# Patient Record
Sex: Female | Born: 1970 | Race: Black or African American | Hispanic: No | Marital: Married | Smoking: Former smoker
Health system: Southern US, Community
[De-identification: ages and names within clinical notes are randomized; demographics above are authoritative.]

## PROBLEM LIST (undated history)

## (undated) ENCOUNTER — Inpatient Hospital Stay (HOSPITAL_COMMUNITY): Payer: Self-pay

## (undated) DIAGNOSIS — F329 Major depressive disorder, single episode, unspecified: Secondary | ICD-10-CM

## (undated) DIAGNOSIS — F32A Depression, unspecified: Secondary | ICD-10-CM

## (undated) DIAGNOSIS — F419 Anxiety disorder, unspecified: Secondary | ICD-10-CM

## (undated) HISTORY — PX: NO PAST SURGERIES: SHX2092

---

## 1999-08-10 ENCOUNTER — Encounter: Payer: Self-pay | Admitting: Emergency Medicine

## 1999-08-10 ENCOUNTER — Emergency Department (HOSPITAL_COMMUNITY): Admission: EM | Admit: 1999-08-10 | Discharge: 1999-08-10 | Payer: Self-pay | Admitting: Emergency Medicine

## 2000-07-18 ENCOUNTER — Encounter: Payer: Self-pay | Admitting: Internal Medicine

## 2000-07-18 ENCOUNTER — Emergency Department (HOSPITAL_COMMUNITY): Admission: EM | Admit: 2000-07-18 | Discharge: 2000-07-18 | Payer: Self-pay | Admitting: Internal Medicine

## 2000-10-25 ENCOUNTER — Other Ambulatory Visit: Admission: RE | Admit: 2000-10-25 | Discharge: 2000-10-25 | Payer: Self-pay | Admitting: Obstetrics

## 2001-01-18 ENCOUNTER — Inpatient Hospital Stay (HOSPITAL_COMMUNITY): Admission: AD | Admit: 2001-01-18 | Discharge: 2001-01-18 | Payer: Self-pay | Admitting: Obstetrics

## 2001-01-18 ENCOUNTER — Encounter: Payer: Self-pay | Admitting: Obstetrics

## 2001-02-17 ENCOUNTER — Inpatient Hospital Stay (HOSPITAL_COMMUNITY): Admission: AD | Admit: 2001-02-17 | Discharge: 2001-02-19 | Payer: Self-pay | Admitting: Obstetrics

## 2007-01-31 ENCOUNTER — Inpatient Hospital Stay (HOSPITAL_COMMUNITY): Admission: AD | Admit: 2007-01-31 | Discharge: 2007-01-31 | Payer: Self-pay | Admitting: Obstetrics & Gynecology

## 2007-03-07 ENCOUNTER — Ambulatory Visit (HOSPITAL_COMMUNITY): Admission: RE | Admit: 2007-03-07 | Discharge: 2007-03-07 | Payer: Self-pay | Admitting: Obstetrics & Gynecology

## 2007-04-23 ENCOUNTER — Ambulatory Visit (HOSPITAL_COMMUNITY): Admission: RE | Admit: 2007-04-23 | Discharge: 2007-04-23 | Payer: Self-pay | Admitting: Obstetrics & Gynecology

## 2007-06-29 ENCOUNTER — Inpatient Hospital Stay (HOSPITAL_COMMUNITY): Admission: AD | Admit: 2007-06-29 | Discharge: 2007-07-01 | Payer: Self-pay | Admitting: Obstetrics & Gynecology

## 2010-08-24 ENCOUNTER — Ambulatory Visit (HOSPITAL_COMMUNITY): Admission: RE | Admit: 2010-08-24 | Discharge: 2010-08-24 | Payer: Self-pay | Admitting: Obstetrics

## 2010-12-31 ENCOUNTER — Encounter: Payer: Self-pay | Admitting: Obstetrics

## 2011-02-02 ENCOUNTER — Encounter (HOSPITAL_COMMUNITY): Payer: Self-pay

## 2011-02-02 ENCOUNTER — Other Ambulatory Visit: Payer: Self-pay | Admitting: Obstetrics

## 2011-02-02 ENCOUNTER — Inpatient Hospital Stay (HOSPITAL_COMMUNITY)
Admission: AD | Admit: 2011-02-02 | Discharge: 2011-02-02 | Disposition: A | Discharge status: Home / Self Care | Payer: Medicaid Other | Source: Ambulatory Visit | Attending: Obstetrics | Admitting: Obstetrics

## 2011-02-02 ENCOUNTER — Ambulatory Visit (HOSPITAL_COMMUNITY)
Admission: RE | Admit: 2011-02-02 | Discharge: 2011-02-02 | Disposition: A | Discharge status: Home / Self Care | Payer: Medicaid Other | Source: Ambulatory Visit | Attending: Obstetrics | Admitting: Obstetrics

## 2011-02-02 DIAGNOSIS — O480 Post-term pregnancy: Secondary | ICD-10-CM | POA: Insufficient documentation

## 2011-02-02 DIAGNOSIS — O288 Other abnormal findings on antenatal screening of mother: Secondary | ICD-10-CM

## 2011-02-02 DIAGNOSIS — O368390 Maternal care for abnormalities of the fetal heart rate or rhythm, unspecified trimester, not applicable or unspecified: Secondary | ICD-10-CM | POA: Insufficient documentation

## 2011-02-03 ENCOUNTER — Inpatient Hospital Stay (HOSPITAL_COMMUNITY)
Admission: RE | Admit: 2011-02-03 | Discharge: 2011-02-05 | DRG: 775 | Disposition: A | Discharge status: Home / Self Care | Payer: Medicaid Other | Source: Ambulatory Visit | Attending: Obstetrics | Admitting: Obstetrics

## 2011-02-03 DIAGNOSIS — D259 Leiomyoma of uterus, unspecified: Secondary | ICD-10-CM | POA: Diagnosis present

## 2011-02-03 DIAGNOSIS — D4959 Neoplasm of unspecified behavior of other genitourinary organ: Secondary | ICD-10-CM | POA: Diagnosis present

## 2011-02-03 DIAGNOSIS — O34599 Maternal care for other abnormalities of gravid uterus, unspecified trimester: Secondary | ICD-10-CM | POA: Diagnosis present

## 2011-02-03 DIAGNOSIS — O9903 Anemia complicating the puerperium: Secondary | ICD-10-CM | POA: Diagnosis not present

## 2011-02-03 DIAGNOSIS — D649 Anemia, unspecified: Secondary | ICD-10-CM | POA: Diagnosis not present

## 2011-02-03 DIAGNOSIS — O09529 Supervision of elderly multigravida, unspecified trimester: Principal | ICD-10-CM | POA: Diagnosis present

## 2011-02-03 LAB — CBC
HCT: 32.1 % — ABNORMAL LOW (ref 36.0–46.0)
RDW: 21.2 % — ABNORMAL HIGH (ref 11.5–15.5)
WBC: 9 10*3/uL (ref 4.0–10.5)

## 2011-02-03 LAB — RPR: RPR Ser Ql: NONREACTIVE

## 2011-02-04 LAB — CBC
MCV: 66.3 fL — ABNORMAL LOW (ref 78.0–100.0)
Platelets: 210 10*3/uL (ref 150–400)
RBC: 4.15 MIL/uL (ref 3.87–5.11)
RDW: 21.1 % — ABNORMAL HIGH (ref 11.5–15.5)
WBC: 10.2 10*3/uL (ref 4.0–10.5)

## 2011-02-07 ENCOUNTER — Other Ambulatory Visit (HOSPITAL_COMMUNITY): Payer: Self-pay

## 2011-02-14 NOTE — H&P (Addendum)
  Melinda Bridges, Melinda Bridges     ACCOUNT NO.:  [id number]  MEDICAL RECORD NO.:  [id number]           PATIENT TYPE:  I  LOCATION:  9140                          FACILITY:  [facility]  PHYSICIAN:  Roseanna Rainbow, M.D.DATE OF BIRTH:  [birth year]  DATE OF ADMISSION:  02/03/2011 DATE OF DISCHARGE:                             HISTORY & PHYSICAL   CHIEF COMPLAINT:  The patient is a 40 year old, para 4 with an estimated date of confinement of January 31, 2011, who presents for induction of labor.  HISTORY OF PRESENT ILLNESS:  The patient had presented for routine prenatal visits several days prior to admission.  She was complaining of decreased fetal movement and BPP was 8/8.  ALLERGIES:  No known drug allergies.  MEDICATIONS:  Please see the medication reconciliation form.  OBSTETRIC RISK FACTORS:  Advanced maternal age, uterine fibroids, history of anemia, previous SGA infant.  PRENATAL LABORATORY DATA:  Chlamydia probe negative.  Urine culture and sensitivity no growth.  Pap smear negative.  GC probe negative.  GBS negative on January 09, 2011.  Hepatitis B surface antigen negative. Hematocrit 29.9, hemoglobin 9.2.  Blood type is A+.  Antibody screen negative.  Platelets 225,000.  Rubella nonimmune.  Sickle cell negative. Quad screen negative.  PAST OBSTETRIC HISTORY:  There is a history of 4 spontaneous vaginal deliveries at term, birth weights ranging between 5 and 6 pounds.  No complications.  PAST GYNECOLOGICAL HISTORY:  Noncontributory.  PAST MEDICAL HISTORY:  Please see the above.  There is history of depression.  PAST SURGICAL HISTORY:  She denies.  SOCIAL HISTORY:  She is a homemaker, married, living with her spouse, not currently using alcohol, formerly minimal user, previously smoked less than 1/2 pack per day.  Stress issues include children in the home and financial difficulties, unemployed.  She denies illicit drug use.  FAMILY HISTORY:  Remarkable for  adult-onset diabetes and hypertension.  REVIEW OF SYSTEMS:  Noncontributory.  PHYSICAL EXAMINATION:  Vital signs stable, afebrile.  Fetal heart tracing baseline 140s beats per minute, moderate long-term variability. Accelerations present.  Tocodynamometer, irregular uterine contractions. Sterile vaginal exam per the RN.  The cervix is 1 cm, dilated.  ASSESSMENT:  Multipara at 40+ weeks, for induction of labor, borderline Bishop score, category 1 fetal heart tracing.  PLAN:  Admission; 2-stage induction of labor, anticipate a spontaneous vaginal delivery.     Roseanna Rainbow, M.D.     Melinda Bridges  D:  02/03/2011  T:  02/04/2011  Job:  [id number]  Electronically Signed by Antionette Char M.D. on 02/14/2011 07:36:58 PM

## 2011-09-25 LAB — CBC
HCT: 28.6 — ABNORMAL LOW
HCT: 38.1
Hemoglobin: 11.8 — ABNORMAL LOW
Hemoglobin: 9 — ABNORMAL LOW
MCHC: 31.3
MCV: 67.7 — ABNORMAL LOW
RBC: 4.23
RBC: 5.68 — ABNORMAL HIGH
RDW: 24 — ABNORMAL HIGH
WBC: 13.8 — ABNORMAL HIGH
WBC: 7.8

## 2011-09-26 ENCOUNTER — Encounter (HOSPITAL_COMMUNITY): Payer: Self-pay | Admitting: *Deleted

## 2013-06-04 ENCOUNTER — Inpatient Hospital Stay (HOSPITAL_COMMUNITY)
Admission: AD | Admit: 2013-06-04 | Discharge: 2013-06-09 | DRG: 885 | Disposition: A | Discharge status: Home / Self Care | Payer: Federal, State, Local not specified - Other | Source: Intra-hospital | Attending: Psychiatry | Admitting: Psychiatry

## 2013-06-04 ENCOUNTER — Encounter (HOSPITAL_COMMUNITY): Payer: Self-pay | Admitting: Behavioral Health

## 2013-06-04 ENCOUNTER — Emergency Department (HOSPITAL_COMMUNITY)
Admission: EM | Admit: 2013-06-04 | Discharge: 2013-06-04 | Disposition: A | Discharge status: Other Acute Inpatient Hospital | Payer: No Typology Code available for payment source | Attending: Emergency Medicine | Admitting: Emergency Medicine

## 2013-06-04 ENCOUNTER — Encounter (HOSPITAL_COMMUNITY): Payer: Self-pay | Admitting: Family Medicine

## 2013-06-04 DIAGNOSIS — Z79899 Other long term (current) drug therapy: Secondary | ICD-10-CM

## 2013-06-04 DIAGNOSIS — Z3202 Encounter for pregnancy test, result negative: Secondary | ICD-10-CM | POA: Insufficient documentation

## 2013-06-04 DIAGNOSIS — F39 Unspecified mood [affective] disorder: Secondary | ICD-10-CM | POA: Insufficient documentation

## 2013-06-04 DIAGNOSIS — F1210 Cannabis abuse, uncomplicated: Secondary | ICD-10-CM | POA: Diagnosis present

## 2013-06-04 DIAGNOSIS — R45851 Suicidal ideations: Secondary | ICD-10-CM

## 2013-06-04 DIAGNOSIS — F4310 Post-traumatic stress disorder, unspecified: Secondary | ICD-10-CM

## 2013-06-04 DIAGNOSIS — F3340 Major depressive disorder, recurrent, in remission, unspecified: Secondary | ICD-10-CM

## 2013-06-04 DIAGNOSIS — F329 Major depressive disorder, single episode, unspecified: Secondary | ICD-10-CM

## 2013-06-04 DIAGNOSIS — IMO0002 Reserved for concepts with insufficient information to code with codable children: Secondary | ICD-10-CM

## 2013-06-04 DIAGNOSIS — F411 Generalized anxiety disorder: Secondary | ICD-10-CM | POA: Diagnosis present

## 2013-06-04 DIAGNOSIS — F331 Major depressive disorder, recurrent, moderate: Secondary | ICD-10-CM | POA: Diagnosis present

## 2013-06-04 DIAGNOSIS — F332 Major depressive disorder, recurrent severe without psychotic features: Principal | ICD-10-CM | POA: Diagnosis present

## 2013-06-04 HISTORY — DX: Major depressive disorder, single episode, unspecified: F32.9

## 2013-06-04 HISTORY — DX: Anxiety disorder, unspecified: F41.9

## 2013-06-04 HISTORY — DX: Depression, unspecified: F32.A

## 2013-06-04 LAB — RAPID URINE DRUG SCREEN, HOSP PERFORMED
Barbiturates: NOT DETECTED
Benzodiazepines: NOT DETECTED
Cocaine: NOT DETECTED

## 2013-06-04 LAB — CBC
HCT: 35.2 % — ABNORMAL LOW (ref 36.0–46.0)
Hemoglobin: 10.9 g/dL — ABNORMAL LOW (ref 12.0–15.0)
MCH: 21.6 pg — ABNORMAL LOW (ref 26.0–34.0)
MCHC: 31 g/dL (ref 30.0–36.0)
MCV: 69.8 fL — ABNORMAL LOW (ref 78.0–100.0)
RDW: 18.5 % — ABNORMAL HIGH (ref 11.5–15.5)

## 2013-06-04 LAB — COMPREHENSIVE METABOLIC PANEL
Albumin: 4.2 g/dL (ref 3.5–5.2)
Alkaline Phosphatase: 43 U/L (ref 39–117)
BUN: 14 mg/dL (ref 6–23)
Creatinine, Ser: 0.84 mg/dL (ref 0.50–1.10)
GFR calc Af Amer: >90 mL/min (ref >90)
Glucose, Bld: 98 mg/dL (ref 70–99)
Potassium: 3.7 mEq/L (ref 3.5–5.1)
Total Protein: 7.7 g/dL (ref 6.0–8.3)

## 2013-06-04 LAB — ETHANOL: Alcohol, Ethyl (B): <11 mg/dL (ref 0–11)

## 2013-06-04 LAB — POCT PREGNANCY, URINE: Preg Test, Ur: NEGATIVE

## 2013-06-04 LAB — SALICYLATE LEVEL: Salicylate Lvl: <2 mg/dL — ABNORMAL LOW (ref 2.8–20.0)

## 2013-06-04 MED ORDER — ALUM & MAG HYDROXIDE-SIMETH 200-200-20 MG/5ML PO SUSP: 30.0000 mL | ORAL | Status: DC | PRN

## 2013-06-04 MED ORDER — LORAZEPAM 1 MG PO TABS: 1.0000 mg | ORAL_TABLET | Freq: Three times a day (TID) | ORAL | Status: DC | PRN

## 2013-06-04 MED ORDER — ACETAMINOPHEN 325 MG PO TABS
650.0000 mg | ORAL_TABLET | Freq: Four times a day (QID) | ORAL | Status: DC | PRN
Start: 2013-06-04 — End: 2013-06-09

## 2013-06-04 MED ORDER — TRAZODONE HCL 50 MG PO TABS
50.0000 mg | ORAL_TABLET | Freq: Every evening | ORAL | Status: DC | PRN
  Administered 2013-06-04 – 2013-06-08 (×6): 50 mg via ORAL
  Filled 2013-06-04 (×12): qty 1

## 2013-06-04 MED ORDER — DIAZEPAM 5 MG PO TABS
5.0000 mg | ORAL_TABLET | Freq: Once | ORAL | Status: AC
  Administered 2013-06-04: 5 mg via ORAL
  Filled 2013-06-04: qty 1

## 2013-06-04 MED ORDER — MAGNESIUM HYDROXIDE 400 MG/5ML PO SUSP: 30.0000 mL | Freq: Every day | ORAL | Status: DC | PRN

## 2013-06-04 MED ORDER — TRAZODONE HCL 50 MG PO TABS: 50.0000 mg | ORAL_TABLET | Freq: Every evening | ORAL | Status: DC | PRN

## 2013-06-04 NOTE — ED Provider Notes (Signed)
History    CSN: [id number] Arrival date & time 06/04/13  0100  First MD Initiated Contact with Patient 06/04/13 [phone]     Chief Complaint  Patient presents with  . Medical Clearance   (Consider location/radiation/quality/duration/timing/severity/associated sxs/prior Treatment) HPI History provided by patient and her IVC paperwork. Brought in by [employer]. Husband took out IVC paperwork, states patient has depression and there is concern for safety at home. Patient reportedly weighs denies in front of children and has caused damage at home. Patient admits to multiple stressors. She denies any self injury or suicidal ideation. Symptoms moderate in severity. No psych medications. Was previously treated for depression but stopped taking medications because they "did not work".  She denies any alcohol or drug use.  History reviewed. No pertinent past medical history. History reviewed. No pertinent past surgical history. No family history on file. History  Substance Use Topics  . Smoking status: Never Smoker   . Smokeless tobacco: Not on file  . Alcohol Use: No   OB History   Grav Para Term Preterm Abortions TAB SAB Ect Mult Living   1 1 1             Review of Systems  Constitutional: Negative for fever and chills.  HENT: Negative for neck pain and neck stiffness.   Eyes: Negative for pain.  Respiratory: Negative for shortness of breath.   Cardiovascular: Negative for chest pain.  Gastrointestinal: Negative for vomiting and abdominal pain.  Genitourinary: Negative for dysuria.  Musculoskeletal: Negative for back pain.  Skin: Negative for rash.  Neurological: Negative for headaches.  Psychiatric/Behavioral: Negative for hallucinations and self-injury.  All other systems reviewed and are negative.    Allergies  Review of patient's allergies indicates no known allergies.  Home Medications  No current outpatient prescriptions on file. BP 113/64  Pulse 62  Temp(Src) 99.1 F (37.3  C) (Oral)  Resp 18  Ht 5\' 3"  (1.6 m)  Wt 116 lb 6 oz (52.787 kg)  BMI 20.62 kg/m2  SpO2 100%  LMP 05/27/2013  Breastfeeding? No Physical Exam  Nursing note and vitals reviewed. Constitutional: She is oriented to person, place, and time. She appears well-developed and well-nourished.  HENT:  Head: Normocephalic and atraumatic.  Eyes: EOM are normal. Pupils are equal, round, and reactive to light.  Neck: Neck supple.  Cardiovascular: Normal rate, normal heart sounds and intact distal pulses.   Pulmonary/Chest: Effort normal and breath sounds normal. No respiratory distress.  Musculoskeletal: Normal range of motion. She exhibits no edema.  Neurological: She is alert and oriented to person, place, and time.  Skin: Skin is warm and dry.  Psychiatric:  cooperative and appropriate    ED Course  Procedures (including critical care time)  Results for orders placed during the hospital encounter of 06/04/13  ACETAMINOPHEN LEVEL      Result Value Range   Acetaminophen (Tylenol), Serum <15.0  10 - 30 ug/mL  CBC      Result Value Range   WBC 7.0  4.0 - 10.5 K/uL   RBC 5.04  3.87 - 5.11 MIL/uL   Hemoglobin 10.9 (*) 12.0 - 15.0 g/dL   HCT 56.2 (*) 13.0 - 86.5 %   MCV 69.8 (*) 78.0 - 100.0 fL   MCH 21.6 (*) 26.0 - 34.0 pg   MCHC 31.0  30.0 - 36.0 g/dL   RDW 78.4 (*) 69.6 - 29.5 %   Platelets 250  150 - 400 K/uL  COMPREHENSIVE METABOLIC PANEL  Result Value Range   Sodium 136  135 - 145 mEq/L   Potassium 3.7  3.5 - 5.1 mEq/L   Chloride 102  96 - 112 mEq/L   CO2 29  19 - 32 mEq/L   Glucose, Bld 98  70 - 99 mg/dL   BUN 14  6 - 23 mg/dL   Creatinine, Ser 1.61  0.50 - 1.10 mg/dL   Calcium 9.7  8.4 - 09.6 mg/dL   Total Protein 7.7  6.0 - 8.3 g/dL   Albumin 4.2  3.5 - 5.2 g/dL   AST 17  0 - 37 U/L   ALT 13  0 - 35 U/L   Alkaline Phosphatase 43  39 - 117 U/L   Total Bilirubin 0.2 (*) 0.3 - 1.2 mg/dL   GFR calc non Af Amer 86 (*) >90 mL/min   GFR calc Af Amer >90  >90 mL/min   ETHANOL      Result Value Range   Alcohol, Ethyl (B) <11  0 - 11 mg/dL  SALICYLATE LEVEL      Result Value Range   Salicylate Lvl <2.0 (*) 2.8 - 20.0 mg/dL  URINE RAPID DRUG SCREEN (HOSP PERFORMED)      Result Value Range   Opiates NONE DETECTED  NONE DETECTED   Cocaine NONE DETECTED  NONE DETECTED   Benzodiazepines NONE DETECTED  NONE DETECTED   Amphetamines NONE DETECTED  NONE DETECTED   Tetrahydrocannabinol POSITIVE (*) NONE DETECTED   Barbiturates NONE DETECTED  NONE DETECTED  POCT PREGNANCY, URINE      Result Value Range   Preg Test, Ur NEGATIVE  NEGATIVE   Valium provided  Plan psychiatric evaluation/ disposition  MDM  IVC, history of depression  Labs obtained and reviewed as above    Sunnie Nielsen, MD 06/04/13 [phone]
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
## 2013-06-04 NOTE — ED Notes (Signed)
Patient states that she and her husband had an argument tonight. States her husband "blames her for everything." States that she has anger problems which she has received help for in the past. Denies using knives tonight but has used them in the past, approx 5 months ago.

## 2013-06-04 NOTE — Progress Notes (Signed)
[facility] has seen patient and provided her with a oc application. °

## 2013-06-04 NOTE — ED Notes (Signed)
Report given to Jan RN

## 2013-06-04 NOTE — ED Notes (Signed)
ACT team at bedside.  

## 2013-06-04 NOTE — Progress Notes (Signed)
Melinda Bridges listed on pt scanned medicaid card EPIC updated

## 2013-06-04 NOTE — Tx Team (Signed)
Initial Interdisciplinary Treatment Plan  PATIENT STRENGTHS: (choose at least two) Ability for insight Capable of independent living Communication skills General fund of knowledge Motivation for treatment/growth  PATIENT STRESSORS: Financial difficulties Marital or family conflict Occupational concerns   PROBLEM LIST: Problem List/Patient Goals Date to be addressed Date deferred Reason deferred Estimated date of resolution  Depression      Anxiety      Financial Difficulties      Marital Conflict                                     DISCHARGE CRITERIA:  Ability to meet basic life and health needs Adequate post-discharge living arrangements Improved stabilization in mood, thinking, and/or behavior Motivation to continue treatment in a less acute level of care  PRELIMINARY DISCHARGE PLAN: Attend aftercare/continuing care group Outpatient therapy Participate in family therapy Return to previous living arrangement  PATIENT/FAMIILY INVOLVEMENT: This treatment plan has been presented to and reviewed with the patient, Melinda Bridges.  The patient and family have been given the opportunity to ask questions and make suggestions.  Harold Barban E 06/04/2013, 4:31 PM

## 2013-06-04 NOTE — ED Notes (Signed)
Security called

## 2013-06-04 NOTE — ED Notes (Signed)
One bag of belongings locked in locker #26 in [place]
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
## 2013-06-05 DIAGNOSIS — F39 Unspecified mood [affective] disorder: Secondary | ICD-10-CM

## 2013-06-05 MED ORDER — FLUOXETINE HCL 20 MG PO CAPS
20.0000 mg | ORAL_CAPSULE | Freq: Every day | ORAL | Status: DC
  Administered 2013-06-05 – 2013-06-07 (×3): 20 mg via ORAL
  Filled 2013-06-05 (×5): qty 1

## 2013-06-05 MED ORDER — HYDROXYZINE HCL 25 MG PO TABS
25.0000 mg | ORAL_TABLET | Freq: Three times a day (TID) | ORAL | Status: DC
  Administered 2013-06-05 – 2013-06-07 (×6): 25 mg via ORAL
  Filled 2013-06-05 (×12): qty 1

## 2013-06-05 NOTE — Progress Notes (Signed)
Patient ID: Melinda Bridges, female   DOB: 1971/05/31, 42 y.o.   MRN: [id number]  D: Pt denies SI/HI/AVH. Pt is pleasant and cooperative. Pt states " I had a lot of anger, resentment, and hurt when I came in here, but I feel better now, I feel more comfortable opening up with my feelings, the groups are helping me be more open and the meds are helping me focus.  A: Pt was offered support and encouragement. Pt was given scheduled medications. Pt was encourage to attend groups. Q 15 minute checks were done for safety.   R:Pt attended Karaoke  and interacts well with peers and staff. Pt is taking medication. Pt has no complaints at this time.Pt receptive to treatment and safety maintained on unit.

## 2013-06-05 NOTE — Progress Notes (Signed)
D: Patient cooperative with staff and peers. Patient's affect is appropriate to circumstance, but mood is anxious. She reported on the self inventory sheet that she requested medication for sleep, appetite/ability to pay attention are improving and energy level is low. Patient rated depression "4" and feelings of hopelessness "8". Writer observed patient talking and laughing with peers in the dayroom. She's attending and participating in groups.  A: Support and encouragement provided to patient. Scheduled medications administered per MD orders. Maintain Q15 minute checks for safety.  R: Patient receptive. Denies SI/HI/AVH. Patient remains safe.

## 2013-06-05 NOTE — Progress Notes (Signed)
Adult Psychoeducational Group Note  Date:  06/05/2013 Time:  3:02 PM  Group Topic/Focus:  Overcoming Stress:   The focus of this group is to define stress and help patients assess their triggers.  Participation Level:  Minimal  Participation Quality:  Attentive  Affect:  Appropriate  Cognitive:  Appropriate  Insight: Good  Engagement in Group:  Engaged  Modes of Intervention:  Discussion, Education and Socialization  Additional Comments:   Edmonia Caprio 06/05/2013, 3:02 PM
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
## 2013-06-05 NOTE — [facility] Group Notes (Signed)
[facility] LCSW Group Therapy  [facility] 1:15 - 2:30 PM  06/05/2013. 2:52 PM   Type of Therapy:  Group Therapy  Participation Level:  Minimal  Participation Quality:  Attentive  Affect:  Appropriate  Cognitive:  Appropriate  Insight:  Developing/Improving and Engaged  Engagement in Therapy:  Developing/Improving Engaged  Modes of Intervention:  Discussion, Education, Exploration, Problem-Solving, Rapport Building, Support   Summary of Progress/Problems:  Patient listened attentively to speaker from [facility]. Patient thanked the speaker.  [name] 06/05/2013  2:52 PM
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
## 2013-06-05 NOTE — [facility] Suicide Risk Assessment (Signed)
Suicide Risk Assessment  Admission Assessment     Nursing information obtained from:  Patient Demographic factors:  Unemployed Current Mental Status:  Plan to harm others Loss Factors:  Financial problems / change in socioeconomic status Historical Factors:  NA Risk Reduction Factors:  Responsible for children under 40 years of age;Sense of responsibility to family;Living with another person, especially a relative;Positive social support  CLINICAL FACTORS:   Severe Anxiety and/or Agitation Depression:   Aggression Anhedonia Comorbid alcohol abuse/dependence Hopelessness Impulsivity Recent sense of peace/wellbeing Severe Alcohol/Substance Abuse/Dependencies Unstable or Poor Therapeutic Relationship  COGNITIVE FEATURES THAT CONTRIBUTE TO RISK:  Closed-mindedness Polarized thinking    SUICIDE RISK:   Moderate:  Frequent suicidal ideation with limited intensity, and duration, some specificity in terms of plans, no associated intent, good self-control, limited dysphoria/symptomatology, some risk factors present, and identifiable protective factors, including available and accessible social support.  PLAN OF CARE: Patient admitted voluntarily and emergently from [facility] to [facility] for depression, anger management and suicidal and homicidal ideations and acting out behaviors. She failed out patient services at [facility]. She took off her medication because that was not helpful.  I certify that inpatient services furnished can reasonably be expected to improve the patient's condition.   Maddalynn Barnard,JANARDHAHA R. 06/05/2013, 10:34 AM

## 2013-06-06 DIAGNOSIS — F329 Major depressive disorder, single episode, unspecified: Secondary | ICD-10-CM

## 2013-06-06 NOTE — Progress Notes (Signed)
Patient ID: Melinda Bridges, female   DOB: Jan 28, 1971, 42 y.o.   MRN: [id number]  D: Pt denies SI/HI/AVH/pain. Pt is pleasant and cooperative. Pt states "love groups, I plan to be more open with my family when I get out, and take anger management classes, so I can eliminate stress in my life"   A: Pt was offered support and encouragement. Pt was given scheduled medications. Pt was encourage to attend groups. Q 15 minute checks were done for safety.   R:Pt attends groups and interacts well with peers and staff. Pt is taking medication. Pt has no complaints at this time.Pt receptive to treatment and safety maintained on unit.
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
## 2013-06-06 NOTE — [facility] Group Notes (Signed)
[facility] LCSW Aftercare Discharge Planning Group Note   06/06/2013 12:16 PM  Participation Quality:  Appropriate  Mood/Affect:  Appropriate and Depressed  Depression Rating:  3  Anxiety Rating:  4  Thoughts of Suicide:  No  Will you contract for safety?   Yes  Current AVH:  No  Plan for Discharge/Comments:  Patient reports being better today and stated this a very supportive environment.   Transportation Means: Patient has transportation.   Supports:  Patient has limited support system.    Chrisean Kloth, Joesph July

## 2013-06-06 NOTE — Progress Notes (Signed)
D: Patient pleasant and cooperative with staff and peers. Patient's affect is appropriate to circumstance and mood is anxious. She reported on the self inventory sheet that she's sleeping well, appetite is good, energy level is low and ability to pay attention is improving. Patient rated depression "3" and feelings of hopelessness "4". Writer observed that the patient often have conversations with her roommate and speaking positive things to keep herself, as well as the roommate uplifted. Patient is participating in groups and interacting with peers in the milieu; compliant with medication regimen.  A: Support and encouragement provided to patient. Administered scheduled medications per ordering MD. Monitor Q15 minute checks for safety.  R: Patient receptive. Denies SI/HI/AVH. Patient remains safe on the unit.

## 2013-06-06 NOTE — Progress Notes (Signed)
Adult Psychoeducational Group Note  Date:  06/06/2013 Time:  8:00PM Group Topic/Focus:  Wrap-Up Group:   The focus of this group is to help patients review their daily goal of treatment and discuss progress on daily workbooks.  Participation Level:  Active  Participation Quality:  Appropriate and Attentive  Affect:  Appropriate  Cognitive:  Alert and Appropriate  Insight: Appropriate  Engagement in Group:  Engaged  Modes of Intervention:  Discussion  Additional Comments:  Pt. Was attentive and appropriate during tonight's group discussion. Pt stated that she is learning a lot while attending groups. Pt stated that its easier to talk about problems with peers who may share the same issues.   Bing Plume D 06/06/2013, 9:10 PM

## 2013-06-07 MED ORDER — FLUOXETINE HCL 10 MG PO CAPS
30.0000 mg | ORAL_CAPSULE | Freq: Every day | ORAL | Status: DC
  Administered 2013-06-08 – 2013-06-09 (×2): 30 mg via ORAL
  Filled 2013-06-07 (×3): qty 3

## 2013-06-07 MED ORDER — HYDROXYZINE HCL 25 MG PO TABS
25.0000 mg | ORAL_TABLET | Freq: Four times a day (QID) | ORAL | Status: DC | PRN
  Administered 2013-06-09: 25 mg via ORAL

## 2013-06-07 MED ORDER — HYDROXYZINE HCL 25 MG PO TABS: 25.0000 mg | ORAL_TABLET | Freq: Four times a day (QID) | ORAL | Status: DC | PRN

## 2013-06-07 NOTE — Progress Notes (Signed)
Patient ID: Melinda Bridges, female   DOB: 10/16/1971, 42 y.o.   MRN: [id number]  D: Pt denies SI/HI/AVH/pain. Pt is pleasant and cooperative. "I feel wonderful, I was tired earlier, but feel better now" pt  Says she is happy, upbeat and active in the milieu. Pt showing some insight into anger management. Pt  Seems to understand the consequences of impulse behavior.  A: Pt was offered support and encouragement. Pt was given scheduled medications. Pt was encourage to attend groups. Q 15 minute checks were done for safety.   R:Pt attends groups and interacts well with peers and staff. Pt is taking medication. Pt has no complaints at this time.Pt receptive to treatment and safety maintained on unit.
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
## 2013-06-07 NOTE — Progress Notes (Signed)
Psychoeducational Group Note  Date: 06/07/2013 Time:  1015  Group Topic/Focus:  Identifying Needs:   The focus of this group is to help patients identify their personal needs that have been historically problematic and identify healthy behaviors to address their needs.  Participation Level:  Active  Participation Quality:  Attentive  Affect:  Appropriate  Cognitive:  Oriented  Insight:  Improving  Engagement in Group:  Engaged  Additional Comments:  Pt is gaining insight. Working on issues.  Kahliya Fraleigh A 

## 2013-06-07 NOTE — Progress Notes (Signed)
 .  Psychoeducational Group Note    Date: 06/07/2013 Time:  0900  Goal Setting Purpose of Group: To be able to set a goal that is measurable and that can be accomplished in one day Participation Level:  Did not attend  Dione Housekeeper

## 2013-06-07 NOTE — Progress Notes (Signed)
Patient ID: Melinda Bridges, female   DOB: 1971-12-08, 42 y.o.   MRN: [id number] [facility] MD Progress Note  06/07/2013 11:57 AM Melinda Bridges  MRN:  [id number] Subjective:  Melinda Bridges reports that her anxiety is lower since being started on Prozac but that she is feeling sleepy during the day. The patient is feeling stable in the hospital but worries about returning to her stressful living situation with her husband. She reports not having angry outbursts in the hospital. Patient expresses intent to attend anger management classes as this is still a problem for her. Patient rates her depression at five and denies feeling anxious. Patient reports passive SI with no plan.    Diagnosis:  Axis I: Major Depression, single episode  ADL's:  Intact  Sleep: Fair  Appetite:  Fair  Suicidal Ideation:  Patient has been angry to self and her boy friend and wishes to end her life Homicidal Ideation:  denied AEB (as evidenced by):  Psychiatric Specialty Exam: Review of Systems  Constitutional: Negative.   HENT: Negative.   Eyes: Negative.   Respiratory: Negative.   Cardiovascular: Negative.   Gastrointestinal: Negative.   Genitourinary: Negative.   Musculoskeletal: Negative.   Skin: Negative.   Neurological: Negative.   Endo/Heme/Allergies: Negative.   Psychiatric/Behavioral: Positive for depression and suicidal ideas. Negative for hallucinations, memory loss and substance abuse. The patient is nervous/anxious. The patient does not have insomnia.     Blood pressure 112/72, pulse 87, temperature 98 F (36.7 C), temperature source Oral, resp. rate 18, height 5' 2.5" (1.588 m), weight 53.978 kg (119 lb), last menstrual period 05/27/2013.Body mass index is 21.41 kg/(m^2).  General Appearance: Guarded  Eye Contact::  Fair  Speech:  Clear and Coherent  Volume:  Decreased  Mood:  Depressed  Affect:  Constricted  Thought Process:  Goal Directed  Orientation:  Full (Time, Place, and Person)  Thought  Content:  Rumination  Suicidal Thoughts:  Yes.  without intent/plan  Homicidal Thoughts:  No  Memory:  Immediate;   Fair Recent;   Fair  Judgement:  Impaired  Insight:  Lacking  Psychomotor Activity:  Psychomotor Retardation  Concentration:  Negative  Recall:  Fair  Akathisia:  Negative  Handed:  Right  AIMS (if indicated):     Assets:  Communication Skills Desire for Improvement Housing Physical Health Social Support Transportation  Sleep:  Number of Hours: 5.25   Current Medications: Current Facility-Administered Medications  Medication Dose Route Frequency Provider Last Rate Last Dose  . acetaminophen (TYLENOL) tablet 650 mg  650 mg Oral Q6H PRN Shuvon Rankin, NP      . alum & mag hydroxide-simeth (MAALOX/MYLANTA) 200-200-20 MG/5ML suspension 30 mL  30 mL Oral Q4H PRN Shuvon Rankin, NP      . FLUoxetine (PROZAC) capsule 20 mg  20 mg Oral Daily Fransisca Kaufmann, NP   20 mg at 06/07/13 0900  . hydrOXYzine (ATARAX/VISTARIL) tablet 25 mg  25 mg Oral TID Fransisca Kaufmann, NP   25 mg at 06/07/13 0900  . magnesium hydroxide (MILK OF MAGNESIA) suspension 30 mL  30 mL Oral Daily PRN Shuvon Rankin, NP      . traZODone (DESYREL) tablet 50 mg  50 mg Oral QHS,MR X 1 Spencer E Simon, PA-C   50 mg at 06/06/13 2339    Lab Results: No results found for this or any previous visit (from the past 48 hour(s)).  Treatment Plan Summary: Daily contact with patient to assess and evaluate symptoms and progress in treatment Medication  management  Plan: Continue crisis management and stabilization.  Medication management: Reviewed with patient who stated no untoward effects. D/C scheduled vistaril due to patient complaints of sedation. Increase Prozac to 30 to target ongoing symptoms of depression.  Encouraged patient to attend groups and participate in group counseling sessions and activities.  Discharge plan in progress.  Address health issues: Vitals reviewed and stable. Patient denies any physical  complaints.  Continue current treatment plan.    Medical Decision Making Problem Points:  Established problem, worsening (2), Review of last therapy session (1) and Review of psycho-social stressors (1) Data Points:  Review or order clinical lab tests (1) Review of medication regiment & side effects (2) Review of new medications or change in dosage (2)  I certify that inpatient services furnished can reasonably be expected to improve the patient's condition.   Delaney Schnick NP-C 06/07/2013, 11:57 AM

## 2013-06-07 NOTE — [facility] Group Notes (Signed)
[facility] Group Notes:  (Clinical Social Work)  06/07/2013   3:00-4:00PM  Summary of Progress/Problems:   The main focus of today's process group was for the patient to identify something in their life that led to their hospitalization that they would like to change, then to discuss their motivation to change.  The Stages of Change were explained to the group, then each patient identified where they are in that process.  A scaling question was used with motivation interviewing to determine the patient's current motivation to change the identified behavior (1-10, low to high). The patient expressed that she wants to change her negative self talk, and said that her motivation is 8 out of 10 because she is scared to fail.  She feels she has made the decision to change.  Type of Therapy:  Process Group  Participation Level:  Active  Participation Quality:  Attentive and Sharing  Affect:  Blunted and Depressed  Cognitive:  Appropriate and Oriented  Insight:  Engaged  Engagement in Therapy:  Engaged  Modes of Intervention:  Education, Motivational Interviewing   Ambrose Mantle, LCSW 06/07/2013, 5:04 PM

## 2013-06-07 NOTE — Progress Notes (Signed)
[facility] Group Notes:  (Nursing/MHT/Case Management/Adjunct)  Date:  06/07/2013  Time:  2000  Type of Therapy:  Psychoeducational Skills  Participation Level:  Active  Participation Quality:  Attentive  Affect:  Appropriate  Cognitive:  Appropriate  Insight:  Good  Engagement in Group:  Engaged  Modes of Intervention:  Education  Summary of Progress/Problems: The patient verbalized in group that she had a great day. She credits her good day to having gone down to the gym to play volleyball with her peers. In addition, she shared with the group that she had a lot of fun with her peers on the unit. Her goal for tomorrow is to get more rest and to work on coping skills to be used outside of the hospital.   Westly Pam 06/07/2013, 9:52 PM

## 2013-06-08 DIAGNOSIS — F332 Major depressive disorder, recurrent severe without psychotic features: Principal | ICD-10-CM

## 2013-06-08 DIAGNOSIS — F411 Generalized anxiety disorder: Secondary | ICD-10-CM

## 2013-06-08 DIAGNOSIS — F331 Major depressive disorder, recurrent, moderate: Secondary | ICD-10-CM | POA: Diagnosis present

## 2013-06-08 NOTE — Progress Notes (Signed)
Goals  Group Note This group  is centered on helping patients identify something they are greatful  For and helping them identify means they have available to meet the goal. Date:  06/08/2013 Time:  Group Topic/Focus:  MParticipation Level:  Active  Participation Quality:  Appropriate  Affect:  Appropriate  Cognitive:  Lacking  Insight:  Engaged  Engagement in Group:  Engaged  Additional Comments:    Melinda Bridges 12:48 PM. 06/08/2013

## 2013-06-08 NOTE — Progress Notes (Signed)
Psychoeducational Group Note  Date:  06/08/2013 Time: 1015 Group Topic/Focus:  Making Healthy Choices:   The focus of this group is to help patients identify negative/unhealthy choices they were using prior to admission and identify positive/healthier coping strategies to replace them upon discharge.  Participation Level:  Active  Participation Quality:  Appropriate  Affect:  Appropriate  Cognitive:  Appropriate  Insight:  Engaged  Engagement in Group:  Engaged  Additional Comments:    Rich Brave 3:26 PM. 06/08/2013

## 2013-06-08 NOTE — Progress Notes (Signed)
Adult Psychoeducational Group Note  Date:  06/08/2013 Time:  800 pm  Group Topic/Focus:  Wrap-Up Group:   The focus of this group is to help patients review their daily goal of treatment and discuss progress on daily workbooks.  Participation Level:  Active  Participation Quality:  Appropriate  Affect:  Appropriate  Cognitive:  Appropriate  Insight: Appropriate  Engagement in Group:  Engaged  Modes of Intervention:  Discussion  Additional Comments:  Pt reported her goal for the day was to continue to interact with others in a positive manner, which she was able to do.   Pt reported that her goal for the following day is to go home  To be with the supports that she has in place.  Marvis Moeller A 06/08/2013, 11:44 PM

## 2013-06-08 NOTE — Progress Notes (Signed)
D: Patient in the hallway on approach.  Patient states she is feeling much better.  Patient states she has learned that she has to go to anger management.  Patient states she knows she is not completely better but she states she is going to the right direction.  Patient denies SI/HI and denies AVH. A: Staff to monitor Q 15 mins for safety.  Encouragement and support offered.  Scheduled medications administered per orders. R: Patient remains safe on the unit.  Patient attended group tonight.  Patient visible on the unit and interacting with peers.  Patient cooperative and taking administered medications.

## 2013-06-08 NOTE — Progress Notes (Signed)
[facility] MD Progress Note  06/08/2013 9:25 AM Melinda Bridges  MRN:  [id number]  Subjective:  Patient rated 3/10 depression, no suicidal thoughts, 5-6/10 anxiety, sleep fair--repeat order in place, appetite is "pretty good", working on anger management coping skills--40 yo, 40 yo, 39 yo, 40 yo, and 25 yo children--living at home with her and her husband.  She has resolved things with her husband, medicines are helping her stay calm and focused and anxiety--she did not realize how much she needed "all that".  Diagnosis:   Axis I: Anxiety Disorder NOS and Major Depression, Recurrent severe Axis II: Deferred Axis III:  Past Medical History  Diagnosis Date  . Anxiety   . Depression    Axis IV: other psychosocial or environmental problems, problems related to social environment and problems with primary support group Axis V: 41-50 serious symptoms  ADL's:  Intact  Sleep: Fair  Appetite:  Good  Suicidal Ideation:  Denies Homicidal Ideation:  Denies Psychiatric Specialty Exam: Review of Systems  Constitutional: Negative.   HENT: Negative.   Eyes: Negative.   Respiratory: Negative.   Cardiovascular: Negative.   Gastrointestinal: Negative.   Genitourinary: Negative.   Musculoskeletal: Negative.   Skin: Negative.   Neurological: Negative.   Endo/Heme/Allergies: Negative.   Psychiatric/Behavioral: Positive for depression. The patient is nervous/anxious.     Blood pressure 122/73, pulse 60, temperature 97.2 F (36.2 C), temperature source Oral, resp. rate 18, height 5' 2.5" (1.588 m), weight 53.978 kg (119 lb), last menstrual period 05/27/2013.Body mass index is 21.41 kg/(m^2).  General Appearance: Casual  Eye Contact::  Fair  Speech:  Normal Rate  Volume:  Normal  Mood:  Anxious and Depressed  Affect:  Congruent  Thought Process:  Coherent  Orientation:  Full (Time, Place, and Person)  Thought Content:  WDL  Suicidal Thoughts:  No  Homicidal Thoughts:  No  Memory:  Immediate;    Fair Recent;   Fair Remote;   Fair  Judgement:  Fair  Insight:  Fair  Psychomotor Activity:  Decreased  Concentration:  Fair  Recall:  Fair  Akathisia:  No  Handed:  Right  AIMS (if indicated):     Assets:  Communication Skills Physical Health Resilience Social Support  Sleep:  Number of Hours: 5.25   Current Medications: Current Facility-Administered Medications  Medication Dose Route Frequency Provider Last Rate Last Dose  . acetaminophen (TYLENOL) tablet 650 mg  650 mg Oral Q6H PRN Shuvon Rankin, NP      . alum & mag hydroxide-simeth (MAALOX/MYLANTA) 200-200-20 MG/5ML suspension 30 mL  30 mL Oral Q4H PRN Shuvon Rankin, NP      . FLUoxetine (PROZAC) capsule 30 mg  30 mg Oral Daily Fransisca Kaufmann, NP   30 mg at 06/08/13 0805  . hydrOXYzine (ATARAX/VISTARIL) tablet 25 mg  25 mg Oral Q6H PRN Fransisca Kaufmann, NP      . magnesium hydroxide (MILK OF MAGNESIA) suspension 30 mL  30 mL Oral Daily PRN Shuvon Rankin, NP      . traZODone (DESYREL) tablet 50 mg  50 mg Oral QHS,MR X 1 Spencer E Simon, PA-C   50 mg at 06/07/13 2250    Lab Results: No results found for this or any previous visit (from the past 48 hour(s)).  Physical Findings: AIMS: Facial and Oral Movements Muscles of Facial Expression: None, normal Lips and Perioral Area: None, normal Jaw: None, normal Tongue: None, normal,Extremity Movements Upper (arms, wrists, hands, fingers): None, normal Lower (legs, knees, ankles, toes): None, normal,  Trunk Movements Neck, shoulders, hips: None, normal, Overall Severity Severity of abnormal movements (highest score from questions above): None, normal Incapacitation due to abnormal movements: None, normal Patient's awareness of abnormal movements (rate only patient's report): No Awareness, Dental Status Current problems with teeth and/or dentures?: No Does patient usually wear dentures?: No  CIWA:    COWS:     Treatment Plan Summary: Daily contact with patient to assess and evaluate  symptoms and progress in treatment Medication management  Plan:  Review of chart, vital signs, medications, and notes. 1-Individual and group therapy 2-Medication management for depression, anger, and anxiety:  Medications reviewed with the patient and she stated no untoward effects--Trazodone increased to repeat x 1 for sleep 3-Coping skills for depression, anxiety, and anger 4-Continue crisis stabilization and management 5-Address health issues--monitoring vital signs, stable 6-Treatment plan in progress to prevent relapse of depression, anger, and anxiety  Medical Decision Making Problem Points:  Established problem, stable/improving (1) and Review of psycho-social stressors (1) Data Points:  Review of new medications or change in dosage (2)  I certify that inpatient services furnished can reasonably be expected to improve the patient's condition.   Nanine Means, PMH-NP 06/08/2013, 9:25 AM  Reviewed the information documented and agree with the treatment plan.  Carrie Usery,JANARDHAHA R. 07/15/2013 1:02 PM

## 2013-06-08 NOTE — Progress Notes (Signed)
Patient ID: Melinda Bridges, female   DOB: 1971-11-25, 42 y.o.   MRN: [id number]  D; Pt has been flat and depressed on the unit, she does brighten up on approach. Pt reported that she was having a much better day, and that her stress level is decreasing. Pt has attended all groups and has engaged in treatment. Pt reported being negative SI/HI, no AH/VH noted. Pt took all medications without any problems, and no other issues noted.  A: 15 minute checks continued for patient safety. R: Pts safety maintained.

## 2013-06-08 NOTE — [facility] Group Notes (Signed)
[facility] Group Notes:  (Clinical Social Work)  06/08/2013   3:00-4:00PM  Summary of Progress/Problems:   The main focus of today's process group was to   identify the patient's current support system and decide on other supports that can be put in place.  Four definitions/levels of support were discussed.  An emphasis was placed on using counselor, doctor, therapy groups, 12-step groups, and problem-specific support groups to expand supports.  The group also discussed the stigma that goes along with mental illness and how to be self-protective while asking for additional supports from friends/church members/family.  The patient verbalized understanding of the importance of increasing support to prevent relapse.  She stated she wants to work on getting more support from friends but is not sure how she will do this.  Type of Therapy:  Process Group  Participation Level:  Active  Participation Quality:  Attentive  Affect:  Appropriate  Cognitive:  Appropriate  Insight:  Developing/Improving  Engagement in Therapy:  Engaged  Modes of Intervention:  Education,  Support and [employer], LCSW 06/08/2013, 5:06 PM

## 2013-06-08 NOTE — Progress Notes (Signed)
Adult Psychoeducational Group Note  Date:  06/08/2013 Time:  7:04 PM  Group Topic/Focus:  Emotional Education:   The focus of this group is to discuss what feelings/emotions are, and how they are experienced.  Participation Level:  Active  Participation Quality:  Appropriate and Attentive  Affect:  Appropriate  Cognitive:  Appropriate and Oriented  Insight: Appropriate  Engagement in Group:  Engaged and Supportive  Modes of Intervention:  Activity, Discussion and Support  Additional Comments:  Pt actively participated in group. Pt was supportive and provided encouragement and appropriate feedback and advice to other patients.  Caswell Corwin 06/08/2013, 7:04 PM

## 2013-06-09 MED ORDER — FLUOXETINE HCL 10 MG PO CAPS: 30.0000 mg | ORAL_CAPSULE | Freq: Every day | ORAL | Status: DC

## 2013-06-09 MED ORDER — TRAZODONE HCL 50 MG PO TABS: 50.0000 mg | ORAL_TABLET | Freq: Every evening | ORAL | Status: DC | PRN

## 2013-06-09 NOTE — Progress Notes (Signed)
Adult Psychoeducational Group Note  Date:  06/09/2013 Time:  11:00 AM Group Topic/Focus:  Self Care:   The focus of this group is to help patients understand the importance of self-care in order to improve or restore emotional, physical, spiritual, interpersonal, and financial health.  Participation Level:  Active  Participation Quality:  Appropriate  Affect:  Appropriate  Cognitive:  Appropriate  Insight: Good  Engagement in Group:  Engaged  Modes of Intervention:  Education  Additional Comments:  Pt was active and attentive during the time she was present at group. Pt was called out of the group to participate in a meeting with her case manager.  Malachy Moan 06/09/2013, 3:11 PM

## 2013-06-09 NOTE — Progress Notes (Signed)
D:  Patient's self inventory sheet, patient sleeps fair, good appetite, normal energy level, good attention span.  Rated depression #3, hopelessness #2, anxiety #8.  Denied withdrawals.   Denied SI.  Denied physical problems, zero pain.  After discharge, plans "anger management classes, open communication, supportive people around me.  Can I have bus cards when I leave?"  A:  Medications administered per MD orders.  Emotional support and encouragement given patient. R:  Denied SI and HI.  Denied A/V hallucinations.  Will continue to monitor patient every 15 minutes for safety.  Safety maintained.
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
## 2013-06-09 NOTE — [facility] Suicide Risk Assessment (Signed)
[facility] INPATIENT:  Family/Significant Other Suicide Prevention Education  Suicide Prevention Education:  Education Completed; Dottie Vaquerano, Husband, [phone]; has been identified by the patient as the family member/significant other with whom the patient will be residing, and identified as the person(s) who will aid the patient in the event of a mental health crisis (suicidal ideations/suicide attempt).  With written consent from the patient, the family member/significant other has been provided the following suicide prevention education, prior to the and/or following the discharge of the patient.  The suicide prevention education provided includes the following:  Suicide risk factors  Suicide prevention and interventions  National Suicide Hotline telephone number  [facility] assessment telephone number  [facility] Emergency Assistance 911  [facility] and/or Residential Mobile Crisis Unit telephone number  Request made of family/significant other to:  Remove weapons (e.g., guns, rifles, knives), all items previously/currently identified as safety concern.  Husband advised patient does not have access to guns.  Remove drugs/medications (over-the-counter, prescriptions, illicit drugs), all items previously/currently identified as a safety concern.  The family member/significant other verbalizes understanding of the suicide prevention education information provided.  The family member/significant other agrees to remove the items of safety concern listed above.  [name] 06/09/2013, 12:17 PM

## 2013-06-09 NOTE — [facility] Group Notes (Signed)
[facility] LCSW Aftercare Discharge Planning Group Note   06/09/2013 11:51 AM  Participation Quality:  Appropriate  Mood/Affect:  Appropriate  Depression Rating:  0  Anxiety Rating:  0  Thoughts of Suicide:  No  Will you contract for safety?   NA  Current AVH:  Yes  Plan for Discharge/Comments:  Patient reports being much better today and ready to discharge home.  She advised of contacting [facility] to terminate services.  Transportation Means: Patient has transportation.   Supports:  Patient has a good support system.   Husain Costabile, Joesph July

## 2013-06-09 NOTE — Progress Notes (Signed)
[facility] Adult Case Management Discharge Plan :  Will you be returning to the same living situation after discharge: Yes,  patient is returning to her home. At discharge, do you have transportation home?:  Yes,Patient has transportation. Do you have the ability to pay for your medications:No, patient will be assisted with indigent medications.   Release of information consent forms completed and in the chart;  Patient's signature needed at discharge.  Patient to Follow up at: Follow-up Information   Follow up with [facility] On 06/11/2013. (Wednesday, June 11, 2013 at 1 PM)    Contact information:   [address] [place], [place]   [address]  [phone]      Follow up with [facility] On 06/10/2013. (Please go to [facility] walk in clinic on Tuesday, June 10, 2013 or any weekday between 8AM-3PM for medication managment)    Contact information:   [address] [place], [place]   [address]  [phone]      Patient denies SI/HI:   Patient no longer endorsing SI/HI or other thoughts of self harm.     Safety Planning and Suicide Prevention discussed:  .Reviewed with all patients during discharge planning group   Riker Collier, Joesph July 06/09/2013, 11:57 AM

## 2013-06-09 NOTE — [facility] Suicide Risk Assessment (Signed)
Suicide Risk Assessment  Discharge Assessment     Demographic Factors:  Low socioeconomic status, Unemployed and female  Mental Status Per Nursing Assessment::   On Admission:  Plan to harm others  Current Mental Status by Physician: patient denies suicidal ideation, intent or plan.  Loss Factors: Financial problems/change in socioeconomic status  Historical Factors: Impulsivity  Risk Reduction Factors:   Sense of responsibility to family and Positive social support  Continued Clinical Symptoms:  Resolving mood symptoms  Cognitive Features That Contribute To Risk:  Closed-mindedness    Suicide Risk:  Minimal: No identifiable suicidal ideation.  Patients presenting with no risk factors but with morbid ruminations; may be classified as minimal risk based on the severity of the depressive symptoms  Discharge Diagnoses:   AXIS I:  Major depressive disorder recurrent episode  AXIS II:  Deferred AXIS III:   Past Medical History  Diagnosis Date  . Anxiety   . Depression    AXIS IV:  other psychosocial or environmental problems and problems related to social environment AXIS V:  61-70 mild symptoms  Plan Of Care/Follow-up recommendations:  Activity:  as tolerated Diet:  healthy Tests:  rouitne Other:  patient to keep her after care appointment  Is patient on multiple antipsychotic therapies at discharge:  No   Has Patient had three or more failed trials of antipsychotic monotherapy by history:  No  Recommended Plan for Multiple Antipsychotic Therapies: N/A  Omya Winfield,MD 06/09/2013, 11:39 AM

## 2013-06-09 NOTE — Progress Notes (Signed)
Discharge Note:  Patient discharged, has bus ticket.   Denied SI and HI.   Denied A/V hallucinations.   Denied pain.  Patient received all her belongings, clothing, miscellaneous items, toiletries, medications, prescriptions.  Patient received suicide prevention information which was discussed with patient who stated she understood and had no questions.   Patient stated she appreciated all assistance received from [facility] staff.

## 2013-06-11 NOTE — Progress Notes (Signed)
Patient Discharge Instructions:  After Visit Summary (AVS):   Faxed to:  06/11/13 Discharge Summary Note:   Faxed to:  06/11/13 Psychiatric Admission Assessment Note:   Faxed to:  06/11/13 Suicide Risk Assessment - Discharge Assessment:   Faxed to:  06/11/13 Faxed/Sent to the Next Level Care provider:  06/11/13 Faxed to [facility] @ [phone] Faxed to [facility] @ [phone]  Jerelene Redden, 06/11/2013, 4:14 PM

## 2014-06-25 ENCOUNTER — Other Ambulatory Visit: Payer: Self-pay | Admitting: *Deleted

## 2014-06-25 DIAGNOSIS — N63 Unspecified lump in unspecified breast: Secondary | ICD-10-CM

## 2014-06-26 ENCOUNTER — Other Ambulatory Visit: Payer: Self-pay | Admitting: *Deleted

## 2014-06-26 DIAGNOSIS — Z1231 Encounter for screening mammogram for malignant neoplasm of breast: Secondary | ICD-10-CM

## 2014-07-02 ENCOUNTER — Other Ambulatory Visit: Payer: Self-pay

## 2014-07-02 ENCOUNTER — Ambulatory Visit: Payer: Self-pay

## 2014-10-12 ENCOUNTER — Encounter (HOSPITAL_COMMUNITY): Payer: Self-pay | Admitting: Behavioral Health

## 2015-08-08 ENCOUNTER — Emergency Department (HOSPITAL_COMMUNITY)
Admission: EM | Admit: 2015-08-08 | Discharge: 2015-08-08 | Disposition: A | Discharge status: Home / Self Care | Payer: Medicaid Other | Attending: Emergency Medicine | Admitting: Emergency Medicine

## 2015-08-08 ENCOUNTER — Encounter (HOSPITAL_COMMUNITY): Payer: Self-pay | Admitting: Emergency Medicine

## 2015-08-08 DIAGNOSIS — F329 Major depressive disorder, single episode, unspecified: Secondary | ICD-10-CM | POA: Insufficient documentation

## 2015-08-08 DIAGNOSIS — R21 Rash and other nonspecific skin eruption: Secondary | ICD-10-CM | POA: Insufficient documentation

## 2015-08-08 DIAGNOSIS — R200 Anesthesia of skin: Secondary | ICD-10-CM | POA: Diagnosis not present

## 2015-08-08 DIAGNOSIS — F419 Anxiety disorder, unspecified: Secondary | ICD-10-CM | POA: Diagnosis not present

## 2015-08-08 LAB — CBG MONITORING, ED: Glucose-Capillary: 102 mg/dL — ABNORMAL HIGH (ref 65–99)

## 2015-08-08 NOTE — ED Notes (Signed)
Pt given turkey sandwich and coke 

## 2015-08-08 NOTE — ED Notes (Signed)
Pt states she is very depressed but denies SI and HI,  States she see a psychiatrist and she was started on Abilify about 3 weeks ago.  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

## 2015-08-08 NOTE — ED Notes (Signed)
Pt slept for a few hours, states she feels better

## 2015-08-08 NOTE — ED Notes (Signed)
Pt has been resting quietly with NAD

## 2015-08-08 NOTE — ED Notes (Signed)
Pt c/o anxiety, HA, weight loss, stress. Denies SI/HI, AVH.

## 2015-08-08 NOTE — Discharge Instructions (Signed)

## 2015-08-08 NOTE — ED Provider Notes (Signed)
CSN: [id number]     Arrival date & time 08/08/15  0138 History   First MD Initiated Contact with Patient 08/08/15 [phone]     Chief Complaint  Patient presents with  . Anxiety     (Consider location/radiation/quality/duration/timing/severity/associated sxs/prior Treatment) HPI Comments: This 44 year old female who is the sole provider for her family, which includes 5 children and husband states she works full-time.  She has extreme anxiety.  Her husband argues all the time with her.  She has a psychiatrist that comes to the house on a weekly basis about 3 weeks ago.  She was started on Abilify.  She does not see any change in her anxiety levels tonight.  Her husband was ordered with her.  She became very upset and she came for further evaluation.  She has no complaints at this time.  She is not suicidal.  She is not homicidal.  She does state that yesterday while registering her son first call.  She had a syncopal episode that spontaneously resolved.  She also reports that she only eats one meal a day.  She does not bring food to her job because she feels she needs to leave as much food at home for the children as possible  Patient is a 44 y.o. female presenting with anxiety. The history is provided by the patient.  Anxiety This is a chronic problem. The current episode started today. The problem occurs intermittently. The problem has been rapidly improving. Associated symptoms include numbness. Pertinent negatives include no abdominal pain, anorexia, chest pain, coughing, fever, headaches, nausea, sore throat, urinary symptoms, vertigo, visual change or weakness. Exacerbated by: Arguing with her husband. She has tried relaxation (Abilify) for the symptoms. The treatment provided no relief.    Past Medical History  Diagnosis Date  . Anxiety   . Depression    History reviewed. No pertinent past surgical history. No family history on file. Social History  Substance Use Topics  . Smoking status:  Never Smoker   . Smokeless tobacco: None  . Alcohol Use: No   OB History    Gravida Para Term Preterm AB TAB SAB Ectopic Multiple Living   1 1 1             Review of Systems  Constitutional: Negative for fever.  HENT: Negative for sore throat.   Respiratory: Negative for cough and shortness of breath.   Cardiovascular: Negative for chest pain.  Gastrointestinal: Negative for nausea, abdominal pain and anorexia.  Genitourinary: Negative for dysuria.  Skin: Negative for pallor and wound.  Neurological: Positive for numbness. Negative for dizziness, vertigo, weakness and headaches.  Psychiatric/Behavioral: Negative for suicidal ideas, sleep disturbance and self-injury. The patient is nervous/anxious.   All other systems reviewed and are negative.     Allergies  Review of patient's allergies indicates no known allergies.  Home Medications   Prior to Admission medications   Medication Sig Start Date End Date Taking? Authorizing Provider  ABILIFY 10 MG tablet Take 10 mg by mouth at bedtime. 07/22/15  Yes Historical Provider, MD  FLUoxetine (PROZAC) 10 MG capsule Take 3 capsules (30 mg total) by mouth daily. For depression Patient not taking: Reported on 08/08/2015 06/09/13   Niel Hummer, NP  traZODone (DESYREL) 50 MG tablet Take 1 tablet (50 mg total) by mouth at bedtime and may repeat dose one time if needed. For sleep Patient not taking: Reported on 08/08/2015 06/09/13   Niel Hummer, NP   BP 120/75 mmHg  Pulse  60  Temp(Src) 98.3 F (36.8 C) (Oral)  Resp 18  SpO2 100%  LMP 08/04/2015 Physical Exam  Constitutional: She is oriented to person, place, and time. She appears well-developed and well-nourished.  HENT:  Head: Normocephalic.  Eyes: Pupils are equal, round, and reactive to light.  Neck: Normal range of motion.  Cardiovascular: Normal rate and regular rhythm.   Pulmonary/Chest: Effort normal and breath sounds normal.  Abdominal: Soft.  Musculoskeletal: Normal range  of motion.  Neurological: She is alert and oriented to person, place, and time.  Skin: Skin is warm and dry. Rash noted.  Psychiatric: She has a normal mood and affect. Her behavior is normal.  Nursing note and vitals reviewed.   ED Course  Procedures (including critical care time) Labs Review Labs Reviewed  CBG MONITORING, ED - Abnormal; Notable for the following:    Glucose-Capillary 102 (*)    All other components within normal limits    Imaging Review No results found. I have personally reviewed and evaluated these images and lab results as part of my medical decision-making.   EKG Interpretation None     Asian has been allowed to rest in the day.  Discharge when buses or riding.  She isn't been encouraged to follow-up with her psychiatrist, try to eat on a regular basis  MDM   Final diagnoses:  Anxiety         Junius Creamer, NP 08/08/15 1121  Linton Flemings, MD 08/08/15 [phone]

## 2016-03-03 ENCOUNTER — Encounter: Payer: Self-pay | Admitting: Certified Nurse Midwife

## 2016-03-03 ENCOUNTER — Ambulatory Visit (INDEPENDENT_AMBULATORY_CARE_PROVIDER_SITE_OTHER): Payer: Medicaid Other | Admitting: Certified Nurse Midwife

## 2016-03-03 VITALS — BP 113/71 | HR 66 | Temp 97.2°F | Wt 120.0 lb

## 2016-03-03 DIAGNOSIS — O09521 Supervision of elderly multigravida, first trimester: Secondary | ICD-10-CM

## 2016-03-03 DIAGNOSIS — O0990 Supervision of high risk pregnancy, unspecified, unspecified trimester: Secondary | ICD-10-CM

## 2016-03-03 DIAGNOSIS — Z3201 Encounter for pregnancy test, result positive: Secondary | ICD-10-CM | POA: Insufficient documentation

## 2016-03-03 MED ORDER — VITAFOL GUMMIES 3.33-0.333-34.8 MG PO CHEW: 3.0000 | CHEWABLE_TABLET | Freq: Every day | ORAL | Status: DC

## 2016-03-03 NOTE — Progress Notes (Signed)
Patient ID: Melinda Bridges, female   DOB: 10-02-1971, 45 y.o.   MRN: [id number]  Subjective:    Melinda Bridges is being seen today for her first obstetrical visit.  This is not a planned pregnancy. She is at Unknown gestation. Her obstetrical history is significant for advanced maternal age. Relationship with FOB: significant other, living together. Patient does intend to breast feed. Pregnancy history fully reviewed.  The information documented in the HPI was reviewed and verified.  Menstrual History: OB History    Gravida Para Term Preterm AB TAB SAB Ectopic Multiple Living   1 1 1              Menarche age: 45 years of age  Patient's last menstrual period was 12/04/2015. Unknown LMP    Past Medical History  Diagnosis Date  . Anxiety   . Depression     History reviewed. No pertinent past surgical history.   (Not in a hospital admission) No Known Allergies  Social History  Substance Use Topics  . Smoking status: Never Smoker   . Smokeless tobacco: Not on file  . Alcohol Use: No    History reviewed. No pertinent family history.   Review of Systems Constitutional: negative for weight loss Gastrointestinal: negative for vomiting Genitourinary:negative for genital lesions and vaginal discharge and dysuria Musculoskeletal:negative for back pain Behavioral/Psych: negative for abusive relationship, depression, illegal drug usage and tobacco use    Objective:    BP 113/71 mmHg  Pulse 66  Temp(Src) 97.2 F (36.2 C)  Wt 120 lb (54.432 kg)  LMP 12/04/2015 General Appearance:    Alert, cooperative, no distress, appears stated age  Head:    Normocephalic, without obvious abnormality, atraumatic  Eyes:    PERRL, conjunctiva/corneas clear, EOM's intact, fundi    benign, both eyes  Ears:    Normal TM's and external ear canals, both ears  Nose:   Nares normal, septum midline, mucosa normal, no drainage    or sinus tenderness  Throat:   Lips, mucosa, and tongue normal; teeth  and gums normal  Neck:   Supple, symmetrical, trachea midline, no adenopathy;    thyroid:  no enlargement/tenderness/nodules; no carotid   bruit or JVD  Back:     Symmetric, no curvature, ROM normal, no CVA tenderness  Lungs:     Clear to auscultation bilaterally, respirations unlabored  Chest Wall:    No tenderness or deformity   Heart:    Regular rate and rhythm, S1 and S2 normal, no murmur, rub   or gallop  Breast Exam:    No tenderness, masses, or nipple abnormality  Abdomen:     Soft, non-tender, bowel sounds active all four quadrants,    no masses, no organomegaly  Genitalia:    Normal female without lesion, discharge or tenderness  Extremities:   Extremities normal, atraumatic, no cyanosis or edema  Pulses:   2+ and symmetric all extremities  Skin:   Skin color, texture, turgor normal, no rashes or lesions  Lymph nodes:   Cervical, supraclavicular, and axillary nodes normal  Neurologic:   CNII-XII intact, normal strength, sensation and reflexes    throughout       Cervix: long, thick, closed and posterior.  FHR:      Lab Review Urine pregnancy test Labs reviewed yes Radiologic studies reviewed no Assessment:    Pregnancy at Unknown weeks    Plan:      Prenatal vitamins.  Counseling provided regarding continued use of seat belts, cessation of alcohol  consumption, smoking or use of illicit drugs; infection precautions i.e., influenza/TDAP immunizations, toxoplasmosis,CMV, parvovirus, listeria and varicella; workplace safety, exercise during pregnancy; routine dental care, safe medications, sexual activity, hot tubs, saunas, pools, travel, caffeine use, fish and methlymercury, potential toxins, hair treatments, varicose veins Weight gain recommendations per IOM guidelines reviewed: underweight/BMI< 18.5--> gain 28 - 40 lbs; normal weight/BMI 18.5 - 24.9--> gain 25 - 35 lbs; overweight/BMI 25 - 29.9--> gain 15 - 25 lbs; obese/BMI >30->gain  11 - 20 lbs Problem list reviewed and  updated. FIRST/CF mutation testing/NIPT/QUAD SCREEN/fragile X/Ashkenazi Jewish population testing/Spinal muscular atrophy discussed: requested. Role of ultrasound in pregnancy discussed; fetal survey: requested. Amniocentesis discussed: not indicated. VBAC calculator score: VBAC consent form provided Meds ordered this encounter  Medications  . Prenatal Vit-Fe Phos-FA-Omega (VITAFOL GUMMIES) 3.33-0.333-34.8 MG CHEW    Sig: Chew 3 tablets by mouth daily.    Dispense:  90 tablet    Refill:  12   Orders Placed This Encounter  Procedures  . Culture, OB Urine  . [place] MFM OB Transvaginal    Standing Status: Future     Number of Occurrences:      Standing Expiration Date: 05/03/2017    Order Specific Question:  Reason for Exam (SYMPTOM  OR DIAGNOSIS REQUIRED)    Answer:  ama, unknown LMP, amenorrhea >9months    Order Specific Question:  Preferred imaging location?    Answer:  [facility]  . [place] MFM OB COMP + 14 WK    Standing Status: Future     Number of Occurrences:      Standing Expiration Date: 05/03/2017    Order Specific Question:  Reason for Exam (SYMPTOM  OR DIAGNOSIS REQUIRED)    Answer:  ama, amenorrhea >4 months    Order Specific Question:  Preferred imaging location?    Answer:  [facility]Ultras[facility]  . Prenatal Profile I  . HIV antibody  . Hemoglobinopathy evaluation  . Varicella zoster antibody, IgG  . VITAMIN D 25 Hydroxy (Vit-D Deficiency, Fractures)  . TSH  . Beta HCG, Quant  . NuSwab Vaginitis Plus (VG+)  . AMB referral to maternal fetal medicine    Referral Priority:  Routine    Referral Type:  Consultation    Referral Reason:  Specialty Services Required    Number of Visits Requested:  1    Follow up in 4 weeks. 50% of 30 min visit spent on counseling and coordination of care.

## 2016-03-06 LAB — PRENATAL PROFILE I(LABCORP)
Antibody Screen: NEGATIVE
BASOS ABS: 0 10*3/uL (ref 0.0–0.2)
Basos: 0 %
EOS (ABSOLUTE): 0.1 10*3/uL (ref 0.0–0.4)
Eos: 1 %
Hematocrit: 32.8 % — ABNORMAL LOW (ref 34.0–46.6)
Hemoglobin: 10 g/dL — ABNORMAL LOW (ref 11.1–15.9)
Hepatitis B Surface Ag: NEGATIVE
Immature Grans (Abs): 0 10*3/uL (ref 0.0–0.1)
Immature Granulocytes: 0 %
LYMPHS ABS: 2.1 10*3/uL (ref 0.7–3.1)
Lymphs: 27 %
MCH: 19.6 pg — AB (ref 26.6–33.0)
MCHC: 30.5 g/dL — ABNORMAL LOW (ref 31.5–35.7)
MCV: 64 fL — AB (ref 79–97)
Monocytes Absolute: 0.6 10*3/uL (ref 0.1–0.9)
Monocytes: 8 %
NEUTROS ABS: 5 10*3/uL (ref 1.4–7.0)
Neutrophils: 64 %
PLATELETS: 313 10*3/uL (ref 150–379)
RBC: 5.11 x10E6/uL (ref 3.77–5.28)
RDW: 20.1 % — AB (ref 12.3–15.4)
RPR Ser Ql: NONREACTIVE
Rh Factor: POSITIVE
Rubella Antibodies, IGG: <0.9 index — ABNORMAL LOW (ref >0.99)
WBC: 7.8 10*3/uL (ref 3.4–10.8)

## 2016-03-06 LAB — HEMOGLOBINOPATHY EVALUATION
HEMOGLOBIN A2 QUANTITATION: 2.1 % (ref 0.7–3.1)
HEMOGLOBIN F QUANTITATION: 0 % (ref 0.0–2.0)
HGB C: 0 %
HGB S: 0 %
Hgb A: 97.9 % (ref 94.0–98.0)

## 2016-03-06 LAB — TSH: TSH: 4.06 u[IU]/mL (ref 0.450–4.500)

## 2016-03-06 LAB — VITAMIN D 25 HYDROXY (VIT D DEFICIENCY, FRACTURES): VIT D 25 HYDROXY: 23.2 ng/mL — AB (ref 30.0–100.0)

## 2016-03-06 LAB — BETA HCG QUANT (REF LAB): hCG Quant: 23472 m[IU]/mL

## 2016-03-06 LAB — VARICELLA ZOSTER ANTIBODY, IGG: VARICELLA: 1934 {index} (ref >165)

## 2016-03-06 LAB — HIV ANTIBODY (ROUTINE TESTING W REFLEX): HIV SCREEN 4TH GENERATION: NONREACTIVE

## 2016-03-07 ENCOUNTER — Other Ambulatory Visit: Payer: Self-pay | Admitting: Certified Nurse Midwife

## 2016-03-07 LAB — NUSWAB VAGINITIS PLUS (VG+)
Atopobium vaginae: HIGH Score — AB
CANDIDA GLABRATA, NAA: NEGATIVE
Candida albicans, NAA: POSITIVE — AB
Chlamydia trachomatis, NAA: NEGATIVE
NEISSERIA GONORRHOEAE, NAA: NEGATIVE
TRICH VAG BY NAA: NEGATIVE

## 2016-03-07 LAB — URINE CULTURE, OB REFLEX

## 2016-03-07 LAB — CULTURE, OB URINE

## 2016-03-08 LAB — PAP IG AND HPV HIGH-RISK
HPV, HIGH-RISK: NEGATIVE
PAP SMEAR COMMENT: 0

## 2016-03-09 ENCOUNTER — Other Ambulatory Visit: Payer: Self-pay | Admitting: Certified Nurse Midwife

## 2016-03-09 ENCOUNTER — Other Ambulatory Visit: Payer: Self-pay | Admitting: *Deleted

## 2016-03-09 DIAGNOSIS — B373 Candidiasis of vulva and vagina: Secondary | ICD-10-CM

## 2016-03-09 DIAGNOSIS — B3731 Acute candidiasis of vulva and vagina: Secondary | ICD-10-CM

## 2016-03-09 DIAGNOSIS — Z3201 Encounter for pregnancy test, result positive: Secondary | ICD-10-CM

## 2016-03-09 MED ORDER — VITAFOL GUMMIES 3.33-0.333-34.8 MG PO CHEW: 3.0000 | CHEWABLE_TABLET | Freq: Every day | ORAL | Status: DC

## 2016-03-09 MED ORDER — TERCONAZOLE 0.4 % VA CREA: 1.0000 | TOPICAL_CREAM | Freq: Every day | VAGINAL | Status: DC

## 2016-03-09 NOTE — Progress Notes (Signed)
Pt request change to pharmacy to [facility] on [address]. Rx were reorder to preferred pharmacy.

## 2016-03-28 ENCOUNTER — Ambulatory Visit (HOSPITAL_COMMUNITY)
Admission: RE | Admit: 2016-03-28 | Discharge: 2016-03-28 | Disposition: A | Discharge status: Home / Self Care | Payer: Medicaid Other | Source: Ambulatory Visit | Attending: Certified Nurse Midwife | Admitting: Certified Nurse Midwife

## 2016-03-28 ENCOUNTER — Encounter (HOSPITAL_COMMUNITY): Payer: Self-pay

## 2016-03-28 ENCOUNTER — Other Ambulatory Visit: Payer: Self-pay | Admitting: Certified Nurse Midwife

## 2016-03-28 VITALS — BP 108/60 | HR 56 | Wt 127.4 lb

## 2016-03-28 DIAGNOSIS — O09529 Supervision of elderly multigravida, unspecified trimester: Secondary | ICD-10-CM | POA: Insufficient documentation

## 2016-03-28 DIAGNOSIS — Z315 Encounter for genetic counseling: Secondary | ICD-10-CM | POA: Insufficient documentation

## 2016-03-28 DIAGNOSIS — O0990 Supervision of high risk pregnancy, unspecified, unspecified trimester: Secondary | ICD-10-CM

## 2016-03-28 DIAGNOSIS — Z3689 Encounter for other specified antenatal screening: Secondary | ICD-10-CM

## 2016-03-28 DIAGNOSIS — Z36 Encounter for antenatal screening of mother: Secondary | ICD-10-CM | POA: Insufficient documentation

## 2016-03-28 DIAGNOSIS — O09521 Supervision of elderly multigravida, first trimester: Secondary | ICD-10-CM | POA: Diagnosis not present

## 2016-03-28 DIAGNOSIS — Z3A09 9 weeks gestation of pregnancy: Secondary | ICD-10-CM | POA: Diagnosis not present

## 2016-03-28 NOTE — Progress Notes (Signed)
Genetic Counseling  High-Risk Gestation Note  Appointment Date:  03/28/2016 Referred By: Morene Crocker, CNM Date of Birth:  [birth year]   Pregnancy History: [id number] Estimated Date of Delivery: 10/27/16 Estimated Gestational Age: [redacted]w[redacted]d Attending: Benjaman Lobe, MD   Melinda Bridges was seen for genetic counseling because of a maternal age of 45 y.o..    In Summary:   Discussed maternal age related risk for fetal aneuploidy  Patient elected to pursue NIPS today  Nuchal translucency ultrasound scheduled 04/21/16 and detailed anatomy ultrasound scheduled 05/31/16  Patient declined maternal serum screening and diagnostic,invasive testing (amniocentesis)  MSAFP only is available to patient in second trimester to assess for ONTDs, if desired  Family history remarkable for mental health conditions for patient and her partner; Limited family history information available for father of the pregnancy, given that he was adopted.   She was counseled regarding maternal age and the association with risk for chromosome conditions due to nondisjunction with aging of the ova.   We reviewed chromosomes, nondisjunction, and the associated 1 in 36 risk for fetal aneuploidy related to a maternal age of 45 y.o. at [redacted]w[redacted]d gestation.  She was counseled that the risk for aneuploidy decreases as gestational age increases, accounting for those pregnancies which spontaneously abort.  We specifically discussed Down syndrome (trisomy 7), trisomies 86 and 26, and sex chromosome aneuploidies (47,XXX and 47,XXY) including the common features and prognoses of each.   We reviewed available screening options including First Screen, Quad screen, noninvasive prenatal screening (NIPS)/cell free DNA (cfDNA) testing, and detailed ultrasound.  She was counseled that screening tests are used to modify a patient's a priori risk for aneuploidy, typically based on age. This estimate provides a pregnancy specific risk  assessment. We reviewed the benefits and limitations of each option. Specifically, we discussed the conditions for which each test screens, the detection rates, and false positive rates of each. She was also counseled regarding diagnostic testing via [facility] and amniocentesis. We reviewed the approximate 1 in [id number] risk for complications for amniocentesis, including spontaneous pregnancy loss. We discussed the possible results that the tests might provide including: positive, negative, unanticipated, and no result. Finally, they were counseled regarding the cost of each option and potential out of pocket expenses.  After consideration of all the options, she elected to proceed with NIPS (Panorama through [facility] laboratory).  Those results will be available in 8-10 days.  She declined maternal serum screening and declined diagnostic, invasive testing ([facility] and amniocentesis).   She also expressed interest in pursuing a nuchal translucency ultrasound, which is scheduled for 04/21/16, and detailed anatomy ultrasound scheduled for 05/31/16.  She understands that screening tests cannot rule out all birth defects or genetic syndromes. The patient was advised of this limitation and states she still does not want additional testing at this time.    Melinda Bridges was provided with written information regarding sickle cell anemia (SCA) including the carrier frequency and incidence in the African-American population, the availability of carrier testing and prenatal diagnosis if indicated.  In addition, we discussed that hemoglobinopathies are routinely screened for as part of the [place] newborn screening panel.  She had hemoglobin electrophoresis performed through her OB office, which revealed the presence of normal adult hemoglobin (Hb AA).  Both family histories were reviewed and found to be contributory for depression and anxiety for the patient and anxiety and history of addiction for the father of the pregnancy. We  discussed that for the majority of cases of mental  health conditions, an underlying genetic cause is not known but a combination of genetic and environmental factors (multifactorial inheritance) are suspected to contribute to their onset.  Recurrence risk for first degree relatives is increased above the general population risk, in the case of multifactorial inheritance observed. In some families, mental health conditions may even be dominant, meaning that when one parent has the condition each child could have up to a 50% risk to inherit the condition. It might be helpful for pediatricians to be aware of this family history to ensure that family members are followed appropriately. The patient understands that prenatal testing or screening is not available for the majority of mental health conditions.    Melinda Bridges did not have information about her paternal family history, and the father of the pregnancy was adopted and did not have information about his biological family history. We thus, cannot comment on how these histories may impact the risk for birth defects or genetic conditions in the pregnancy. Without further information regarding the provided family history, an accurate genetic risk cannot be calculated. Further genetic counseling is warranted if more information is obtained.  Melinda Bridges denied exposure to environmental toxins or chemical agents. She denied the use of alcohol, tobacco or street drugs. She denied significant viral illnesses during the course of her pregnancy. Her medical and surgical histories were contributory for depression and anxiety, for which she reported she is followed by a psychologist/psychiatrist. OB medical records indicate that the patient reported taking Abilify. There are limited data regarding use of Abilify in pregnancy. Available animal study data identified an increased risk for diaphragmatic hernia at maternal levels 10 times the recommended human  dose. There are case reports of use in human pregnancy without adverse consequences.  I counseled Melinda Bridges regarding the above risks and available options. Most of the counseling was provided by Trecia Rogers, [employer] genetic counseling student, under my direct supervision. The approximate face-to-face time with the genetic counselor was 45 minutes.  Chipper Oman, MS,  Certified Genetic Counselor 03/28/2016

## 2016-03-31 ENCOUNTER — Ambulatory Visit (INDEPENDENT_AMBULATORY_CARE_PROVIDER_SITE_OTHER): Payer: Medicaid Other | Admitting: Certified Nurse Midwife

## 2016-03-31 ENCOUNTER — Encounter: Payer: Medicaid Other | Admitting: Certified Nurse Midwife

## 2016-03-31 VITALS — BP 108/64 | HR 65 | Wt 132.0 lb

## 2016-03-31 DIAGNOSIS — Z1389 Encounter for screening for other disorder: Secondary | ICD-10-CM | POA: Diagnosis not present

## 2016-03-31 DIAGNOSIS — O0941 Supervision of pregnancy with grand multiparity, first trimester: Secondary | ICD-10-CM

## 2016-03-31 DIAGNOSIS — O09521 Supervision of elderly multigravida, first trimester: Secondary | ICD-10-CM

## 2016-03-31 DIAGNOSIS — Z331 Pregnant state, incidental: Secondary | ICD-10-CM

## 2016-03-31 LAB — POCT URINALYSIS DIPSTICK
BILIRUBIN UA: NEGATIVE
Blood, UA: NEGATIVE
GLUCOSE UA: NEGATIVE
Ketones, UA: NEGATIVE
LEUKOCYTES UA: NEGATIVE
NITRITE UA: NEGATIVE
Protein, UA: NEGATIVE
Spec Grav, UA: 1.015
Urobilinogen, UA: NEGATIVE
pH, UA: 6.5

## 2016-03-31 MED ORDER — VITAFOL GUMMIES 3.33-0.333-34.8 MG PO CHEW: 3.0000 | CHEWABLE_TABLET | Freq: Every day | ORAL | Status: AC

## 2016-03-31 MED ORDER — FOLIC ACID 1 MG PO TABS: 4.0000 mg | ORAL_TABLET | Freq: Every day | ORAL | Status: DC

## 2016-03-31 NOTE — Progress Notes (Signed)
Confirmation initial made here during annual gyn exam.

## 2016-03-31 NOTE — Addendum Note (Signed)
Addended by: Clarnce Flock E on: 03/31/2016 04:50 PM   Modules accepted: Orders

## 2016-03-31 NOTE — Progress Notes (Signed)
Subjective:    Melinda Bridges is being seen today for her first obstetrical visit.  This is not a planned pregnancy. She is at [redacted]w[redacted]d gestation. Her obstetrical history is significant for advanced maternal age. Relationship with FOB: significant other, living together. Patient does intend to breast feed. Pregnancy history fully reviewed.  The information documented in the HPI was reviewed and verified.  Menstrual History: OB History    Gravida Para Term Preterm AB TAB SAB Ectopic Multiple Living   6 5 5       5        Patient's last menstrual period was 12/04/2015.    Past Medical History  Diagnosis Date  . Anxiety   . Depression     No past surgical history on file.   (Not in a hospital admission) No Known Allergies  Social History  Substance Use Topics  . Smoking status: Never Smoker   . Smokeless tobacco: Not on file  . Alcohol Use: No    No family history on file.   Review of Systems Constitutional: negative for weight loss Gastrointestinal: negative for vomiting Genitourinary:negative for genital lesions and vaginal discharge and dysuria Musculoskeletal:negative for back pain Behavioral/Psych: negative for abusive relationship, depression, illegal drug usage and tobacco use    Objective:    BP 108/64 mmHg  Pulse 65  Wt 132 lb (59.875 kg)  LMP 12/04/2015 General Appearance:    Alert, cooperative, no distress, appears stated age  Head:    Normocephalic, without obvious abnormality, atraumatic  Eyes:    PERRL, conjunctiva/corneas clear, EOM's intact, fundi    benign, both eyes  Ears:    Normal TM's and external ear canals, both ears  Nose:   Nares normal, septum midline, mucosa normal, no drainage    or sinus tenderness  Throat:   Lips, mucosa, and tongue normal; teeth and gums normal  Neck:   Supple, symmetrical, trachea midline, no adenopathy;    thyroid:  no enlargement/tenderness/nodules; no carotid   bruit or JVD  Back:     Symmetric, no curvature, ROM  normal, no CVA tenderness  Lungs:     Clear to auscultation bilaterally, respirations unlabored  Chest Wall:    No tenderness or deformity   Heart:    Regular rate and rhythm, S1 and S2 normal, no murmur, rub   or gallop  Breast Exam:    No tenderness, masses, or nipple abnormality  Abdomen:     Soft, non-tender, bowel sounds active all four quadrants,    no masses, no organomegaly  Genitalia:    Normal female without lesion, discharge or tenderness  Extremities:   Extremities normal, atraumatic, no cyanosis or edema  Pulses:   2+ and symmetric all extremities  Skin:   Skin color, texture, turgor normal, no rashes or lesions  Lymph nodes:   Cervical, supraclavicular, and axillary nodes normal  Neurologic:   CNII-XII intact, normal strength, sensation and reflexes    throughout          Cervix:  Long, thick, closed and posterior.      Lab Review Urine pregnancy test Labs reviewed yes Radiologic studies reviewed yes Assessment:    Pregnancy at [redacted]w[redacted]d weeks   AMA  Plan:   Prenatal vitamins.  Counseling provided regarding continued use of seat belts, cessation of alcohol consumption, smoking or use of illicit drugs; infection precautions i.e., influenza/TDAP immunizations, toxoplasmosis,CMV, parvovirus, listeria and varicella; workplace safety, exercise during pregnancy; routine dental care, safe medications, sexual activity, hot tubs, saunas,  pools, travel, caffeine use, fish and methlymercury, potential toxins, hair treatments, varicose veins Weight gain recommendations per IOM guidelines reviewed: underweight/BMI< 18.5--> gain 28 - 40 lbs; normal weight/BMI 18.5 - 24.9--> gain 25 - 35 lbs; overweight/BMI 25 - 29.9--> gain 15 - 25 lbs; obese/BMI >30->gain  11 - 20 lbs Problem list reviewed and updated. FIRST/CF mutation testing/NIPT/QUAD SCREEN/fragile X/Ashkenazi Jewish population testing/Spinal muscular atrophy discussed: requested.  NIPS at [facility] 03/28/16 Role of ultrasound in pregnancy  discussed; fetal survey: requested. Amniocentesis discussed: not indicated. VBAC calculator score: VBAC consent form provided No orders of the defined types were placed in this encounter.   No orders of the defined types were placed in this encounter.    Follow up in 4 weeks. 50% of 30 min visit spent on counseling and coordination of care.

## 2016-04-04 ENCOUNTER — Telehealth (HOSPITAL_COMMUNITY): Payer: Self-pay | Admitting: MS"

## 2016-04-04 NOTE — Telephone Encounter (Signed)
Left message for patient to return call to discuss information, following up from her visit last Tuesday.   Melinda Bridges 04/04/2016 3:46 PM

## 2016-04-06 ENCOUNTER — Other Ambulatory Visit (HOSPITAL_COMMUNITY): Payer: Self-pay

## 2016-04-06 ENCOUNTER — Telehealth (HOSPITAL_COMMUNITY): Payer: Self-pay | Admitting: MS"

## 2016-04-06 NOTE — Telephone Encounter (Signed)
Called Melinda Bridges to discuss her prenatal cell free DNA test results.  Mrs. Melinda Bridges had Panorama testing through [facility].  Testing was offered because of maternal age.   The patient was identified by name and DOB.  We reviewed that these are consistent with a high risk for Down syndrome (Trisomy 21). She understands that this is not diagnostic, but we discussed that the positive predictive value of this result in this case is approximately 97%.   We discussed the option of follow-up genetic counseling to review these results in more detail, discuss additional screening and testing options in the pregnancy for Down syndrome, and to discuss additional information and resources regarding Down syndrome. She elected to schedule follow-up genetic counseling to coincide with her ultrasound appointment on 04/21/16.   The remainder of the screen is within normal limits, showing a less than 1 in 10,000 risk for trisomies 18 and 13, and monosomy X (Turner syndrome).  In addition, the risk for triploidy and sex chromosome trisomies (47,XXX and 47,XXY) was also low risk.  This testing identifies > 99% of pregnancies with trisomy 68, trisomy 80, sex chromosome trisomies (47,XXX and 47,XXY), and triploidy. The detection rate for trisomy 18 is 96%.  The detection rate for monosomy X is ~92%.  The false positive rate is <0.2% for all conditions. Testing was also consistent with female fetal sex.  The patient did wish to know fetal sex.  She understands that this testing does not identify all genetic conditions.  All questions were answered to her satisfaction, she was encouraged to call with additional questions or concerns. She indicated that at this time she is going to need more time to process this information and to formulate questions but plans to call back if questions arise prior to her scheduled follow-up appointment.   Chipper Oman, MS Insurance risk surveyor

## 2016-04-06 NOTE — Telephone Encounter (Signed)
Patient returned my call yesterday, but I was out of the office. Called patient back to discuss Panorama results. Left message for patient to return call.   Melinda Bridges 04/06/2016 10:32 AM

## 2016-04-21 ENCOUNTER — Other Ambulatory Visit (HOSPITAL_COMMUNITY): Payer: Self-pay

## 2016-04-21 ENCOUNTER — Ambulatory Visit (HOSPITAL_COMMUNITY): Payer: Medicaid Other

## 2016-04-21 ENCOUNTER — Other Ambulatory Visit (HOSPITAL_COMMUNITY): Payer: Medicaid Other

## 2016-04-21 ENCOUNTER — Ambulatory Visit (HOSPITAL_COMMUNITY): Admission: RE | Admit: 2016-04-21 | Payer: Medicaid Other | Source: Ambulatory Visit

## 2016-04-24 ENCOUNTER — Encounter (HOSPITAL_COMMUNITY): Payer: Self-pay | Admitting: *Deleted

## 2016-04-24 ENCOUNTER — Inpatient Hospital Stay (HOSPITAL_COMMUNITY)
Admission: AD | Admit: 2016-04-24 | Discharge: 2016-04-24 | Disposition: A | Discharge status: Home / Self Care | Payer: Medicaid Other | Source: Ambulatory Visit | Attending: Obstetrics | Admitting: Obstetrics

## 2016-04-24 ENCOUNTER — Inpatient Hospital Stay (HOSPITAL_COMMUNITY): Payer: Medicaid Other

## 2016-04-24 DIAGNOSIS — O09522 Supervision of elderly multigravida, second trimester: Secondary | ICD-10-CM | POA: Diagnosis not present

## 2016-04-24 DIAGNOSIS — O09521 Supervision of elderly multigravida, first trimester: Secondary | ICD-10-CM

## 2016-04-24 DIAGNOSIS — Z3A13 13 weeks gestation of pregnancy: Secondary | ICD-10-CM | POA: Diagnosis not present

## 2016-04-24 DIAGNOSIS — O034 Incomplete spontaneous abortion without complication: Secondary | ICD-10-CM | POA: Diagnosis not present

## 2016-04-24 DIAGNOSIS — O209 Hemorrhage in early pregnancy, unspecified: Secondary | ICD-10-CM | POA: Diagnosis not present

## 2016-04-24 DIAGNOSIS — R109 Unspecified abdominal pain: Secondary | ICD-10-CM | POA: Diagnosis present

## 2016-04-24 LAB — WET PREP, GENITAL
Clue Cells Wet Prep HPF POC: NONE SEEN
SPERM: NONE SEEN
Trich, Wet Prep: NONE SEEN
YEAST WET PREP: NONE SEEN

## 2016-04-24 MED ORDER — MISOPROSTOL 200 MCG PO TABS
600.0000 ug | ORAL_TABLET | Freq: Once | ORAL | Status: AC
  Administered 2016-04-24: 600 ug via ORAL
  Filled 2016-04-24: qty 3

## 2016-04-24 MED ORDER — OXYCODONE-ACETAMINOPHEN 5-325 MG PO TABS
2.0000 | ORAL_TABLET | ORAL | Status: DC | PRN
Start: 2016-04-24 — End: 2016-04-28

## 2016-04-24 NOTE — MAU Provider Note (Addendum)
History   [id number] @ 13.3 wks in with cramping and vag bleeding that started at lunchtime. Has not seen provider yet for care per pt.  CSN: [id number]  Arrival date & time 04/24/16  1327   None     Chief Complaint  Patient presents with  . Vaginal Bleeding  . Rectal Bleeding    HPI  Past Medical History  Diagnosis Date  . Anxiety   . Depression     Past Surgical History  Procedure Laterality Date  . No past surgeries      No family history on file.  Social History  Substance Use Topics  . Smoking status: Never Smoker   . Smokeless tobacco: None  . Alcohol Use: No    OB History    Gravida Para Term Preterm AB TAB SAB Ectopic Multiple Living   6 5 5       5       Review of Systems  Constitutional: Negative.   HENT: Negative.   Eyes: Negative.   Respiratory: Negative.   Cardiovascular: Negative.   Gastrointestinal: Positive for abdominal pain.  Genitourinary: Positive for vaginal bleeding.  Musculoskeletal: Negative.   Skin: Negative.   Allergic/Immunologic: Negative.   Neurological: Negative.   Hematological: Negative.   Psychiatric/Behavioral: Negative.     Allergies  Review of patient's allergies indicates no known allergies.  Home Medications  No current outpatient prescriptions on file.  BP 125/55 mmHg  Pulse 55  Temp(Src) 97.4 F (36.3 C) (Oral)  Resp 18  Wt 135 lb 9.6 oz (61.508 kg)  LMP 12/04/2015  Physical Exam  Constitutional: She is oriented to person, place, and time. She appears well-developed and well-nourished.  HENT:  Head: Normocephalic.  Eyes: Pupils are equal, round, and reactive to light.  Neck: Normal range of motion.  Cardiovascular: Normal rate, regular rhythm, normal heart sounds and intact distal pulses.   Pulmonary/Chest: Effort normal and breath sounds normal.  Abdominal: Soft. Bowel sounds are normal.  Genitourinary: Vagina normal and uterus normal.  Musculoskeletal: Normal range of motion.  Neurological: She is  alert and oriented to person, place, and time. She has normal reflexes.  Skin: Skin is warm and dry.  Psychiatric: She has a normal mood and affect. Her behavior is normal. Judgment and thought content normal.    MAU Course  Procedures (including critical care time)  Labs Reviewed - No data to display No results found.   1. Advanced maternal age in multigravida, first trimester     Dx:  MDM  Sterile spec exam done smamt dark vag bleeding, cervix visually closed. Will get vag probe [place] to assess.  US show IUFD at 10.3 wks. Treatment options discussed with pt and she ops to take one time dose of cytotec. POC discussed with Dr. Nehemiah Settle and he is in agreement with POC. Will d/c home with pain management

## 2016-04-24 NOTE — MAU Note (Signed)
Was at work, felt some strange fluttery in abd.  Went to the bathroom, noted vaginal and rectal bleeding, small clots.

## 2016-04-24 NOTE — Discharge Instructions (Signed)
Incomplete Miscarriage A miscarriage is the sudden loss of an unborn baby (fetus) before the 20th week of pregnancy. In an incomplete miscarriage, parts of the fetus or placenta (afterbirth) remain in the body.  Having a miscarriage can be an emotional experience. Talk with your health care provider about any questions you may have about miscarrying, the grieving process, and your future pregnancy plans. CAUSES   Problems with the fetal chromosomes that make it impossible for the baby to develop normally. Problems with the baby's genes or chromosomes are most often the result of errors that occur by chance as the embryo divides and grows. The problems are not inherited from the parents.  Infection of the cervix or uterus.  Hormone problems.  Problems with the cervix, such as having an incompetent cervix. This is when the tissue in the cervix is not strong enough to hold the pregnancy.  Problems with the uterus, such as an abnormally shaped uterus, uterine fibroids, or congenital abnormalities.  Certain medical conditions.  Smoking, drinking alcohol, or taking illegal drugs.  Trauma. SYMPTOMS   Vaginal bleeding or spotting, with or without cramps or pain.  Pain or cramping in the abdomen or lower back.  Passing fluid, tissue, or blood clots from the vagina. DIAGNOSIS  Your health care provider will perform a physical exam. You may also have an ultrasound to confirm the miscarriage. Blood or urine tests may also be ordered. TREATMENT   Usually, a dilation and curettage (D&C) procedure is performed. During a D&C procedure, the cervix is widened (dilated) and any remaining fetal or placental tissue is gently removed from the uterus.  Antibiotic medicines are prescribed if there is an infection. Other medicines may be given to reduce the size of the uterus (contract) if there is a lot of bleeding.  If you have Rh negative blood and your baby was Rh positive, you will need a Rho (D)  immune globulin shot. This shot will protect any future baby from having Rh blood problems in future pregnancies.  You may be confined to bed rest. This means you should stay in bed and only get up to use the bathroom. HOME CARE INSTRUCTIONS   Rest as directed by your health care provider.  Restrict activity as directed by your health care provider. You may be allowed to continue light activity if curettage was not done but you require further treatment.  Keep track of the number of pads you use each day. Keep track of how soaked (saturated) they are. Record this information.  Do not  use tampons.  Do not douche or have sexual intercourse until approved by your health care provider.  Keep all follow-up appointments for reevaluation and continuing management.  Only take over-the-counter or prescription medicines for pain, fever, or discomfort as directed by your health care provider.  Take antibiotic medicine as directed by your health care provider. Make sure you finish it even if you start to feel better. SEEK IMMEDIATE MEDICAL CARE IF:   You experience severe cramps in your stomach, back, or abdomen.  You have an unexplained temperature (make sure to record these temperatures).  You pass large clots or tissue (save these for your health care provider to inspect).  Your bleeding increases.  You become light-headed, weak, or have fainting episodes. MAKE SURE YOU:   Understand these instructions.  Will watch your condition.  Will get help right away if you are not doing well or get worse.   This information is not intended to   replace advice given to you by your health care provider. Make sure you discuss any questions you have with your health care provider.   Document Released: 11/27/2005 Document Revised: 12/18/2014 Document Reviewed: 06/26/2013 Elsevier Interactive Patient Education 2016 [employer]  

## 2016-04-24 NOTE — MAU Note (Signed)
Urine sent to lab 

## 2016-04-25 ENCOUNTER — Encounter: Payer: Self-pay | Admitting: *Deleted

## 2016-04-25 LAB — GC/CHLAMYDIA PROBE AMP (~~LOC~~) NOT AT ARMC
Chlamydia: NEGATIVE
Neisseria Gonorrhea: NEGATIVE

## 2016-04-28 ENCOUNTER — Encounter: Payer: Self-pay | Admitting: *Deleted

## 2016-04-28 ENCOUNTER — Ambulatory Visit (INDEPENDENT_AMBULATORY_CARE_PROVIDER_SITE_OTHER): Payer: Medicaid Other | Admitting: Certified Nurse Midwife

## 2016-04-28 ENCOUNTER — Encounter: Payer: Self-pay | Admitting: Certified Nurse Midwife

## 2016-04-28 VITALS — BP 114/61 | HR 77 | Wt 136.0 lb

## 2016-04-28 DIAGNOSIS — O021 Missed abortion: Secondary | ICD-10-CM

## 2016-04-28 DIAGNOSIS — O039 Complete or unspecified spontaneous abortion without complication: Secondary | ICD-10-CM

## 2016-04-28 MED ORDER — IBUPROFEN 800 MG PO TABS: 800.0000 mg | ORAL_TABLET | Freq: Three times a day (TID) | ORAL | Status: DC | PRN

## 2016-04-28 MED ORDER — OXYCODONE-ACETAMINOPHEN 5-325 MG PO TABS: 2.0000 | ORAL_TABLET | ORAL | Status: DC | PRN

## 2016-04-28 NOTE — Progress Notes (Signed)
Patient ID: Melinda Bridges, female   DOB: January 08, 1971, 44 y.o.   MRN: [id number]  Chief Complaint  Patient presents with  . Follow-up    SAB    HPI Melinda Bridges is a 45 y.o. female.  Here for f/u after recent SAB.  Had SAB noted on [place] on 04/22/16.  Reports heavy vaginal bleeding on that day and since.  Was given cytotec to help her pass tissue, reports passing tissue/clots.   On [place] on 04/22/16 interpretation 1. Single uterine gestation with fetus and no detectable cardiac activity. Cardiac activity detected on comparison US exam of 03/28/2016. Findings are consistent with failed pregnancy. 2. Estimated gestational age by crown-rump length of 10 weeks 3 days is discordant with first-trimester ultrasound of 03/28/2016. Findings meet definitive criteria for failed pregnancy. This follows SRU consensus guidelines: Diagnostic Criteria for Nonviable Pregnancy Early in the First Trimester. Alison Stalling J Med [id number].   HPI  Past Medical History  Diagnosis Date  . Anxiety   . Depression     Past Surgical History  Procedure Laterality Date  . No past surgeries      No family history on file.  Social History Social History  Substance Use Topics  . Smoking status: Never Smoker   . Smokeless tobacco: Not on file  . Alcohol Use: No    No Known Allergies  Current Outpatient Prescriptions  Medication Sig Dispense Refill  . folic acid (FOLVITE) 1 MG tablet Take 4 tablets (4 mg total) by mouth daily. (Patient not taking: Reported on 04/28/2016) 120 tablet 6  . oxyCODONE-acetaminophen (PERCOCET/ROXICET) 5-325 MG tablet Take 2 tablets by mouth every 4 (four) hours as needed for severe pain. (Patient not taking: Reported on 04/28/2016) 6 tablet 0  . Prenatal Vit-Fe Phos-FA-Omega (VITAFOL GUMMIES) 3.33-0.333-34.8 MG CHEW Chew 3 tablets by mouth daily. (Patient not taking: Reported on 04/28/2016) 90 tablet 12   No current facility-administered medications for this visit.    Review of  Systems Review of Systems Constitutional: negative for fatigue and weight loss Respiratory: negative for cough and wheezing Cardiovascular: negative for chest pain, fatigue and palpitations Gastrointestinal: negative for abdominal pain and change in bowel habits Genitourinary: + vaginal bleeding since SAB Integument/breast: negative for nipple discharge Musculoskeletal:negative for myalgias Neurological: negative for gait problems and tremors Behavioral/Psych: negative for abusive relationship, depression Endocrine: negative for temperature intolerance     Blood pressure 114/61, pulse 77, weight 136 lb (61.689 kg), last menstrual period 12/04/2015, unknown if currently breastfeeding.  Physical Exam Physical Exam General:   alert  Skin:   no rash or abnormalities  Lungs:   clear to auscultation bilaterally  Heart:   regular rate and rhythm, S1, S2 normal, no murmur, click, rub or gallop  Breasts:   deferred  Abdomen:  normal findings: no organomegaly, soft, non-tender and no hernia  Pelvis:  deferred    50% of 15 min visit spent on counseling and coordination of care.   Data Reviewed Previous medical hx, meds, labs, [place]  Assessment     Recent SAB ?retained products of conception     Plan    No orders of the defined types were placed in this encounter.   No orders of the defined types were placed in this encounter.    Possible management options include:D&C for retained products of conception Follow up after next ultrasound.     HPI  Review of Systems  Physical Exam

## 2016-04-29 LAB — CBC WITH DIFFERENTIAL/PLATELET
BASOS ABS: 0 10*3/uL (ref 0.0–0.2)
Basos: 0 %
EOS (ABSOLUTE): 0.1 10*3/uL (ref 0.0–0.4)
Eos: 1 %
HEMOGLOBIN: 7.1 g/dL — AB (ref 11.1–15.9)
Hematocrit: 24.2 % — ABNORMAL LOW (ref 34.0–46.6)
IMMATURE GRANS (ABS): 0 10*3/uL (ref 0.0–0.1)
Immature Granulocytes: 1 %
LYMPHS: 23 %
Lymphocytes Absolute: 1.9 10*3/uL (ref 0.7–3.1)
MCH: 19.7 pg — ABNORMAL LOW (ref 26.6–33.0)
MCHC: 29.3 g/dL — ABNORMAL LOW (ref 31.5–35.7)
MCV: 67 fL — ABNORMAL LOW (ref 79–97)
MONOCYTES: 6 %
Monocytes Absolute: 0.5 10*3/uL (ref 0.1–0.9)
NEUTROS PCT: 69 %
Neutrophils Absolute: 5.8 10*3/uL (ref 1.4–7.0)
Platelets: 210 10*3/uL (ref 150–379)
RBC: 3.6 x10E6/uL — AB (ref 3.77–5.28)
RDW: 18.7 % — ABNORMAL HIGH (ref 12.3–15.4)
WBC: 8.3 10*3/uL (ref 3.4–10.8)

## 2016-04-29 LAB — BETA HCG QUANT (REF LAB): HCG QUANT: 497 m[IU]/mL

## 2016-04-30 ENCOUNTER — Other Ambulatory Visit: Payer: Self-pay | Admitting: Certified Nurse Midwife

## 2016-04-30 DIAGNOSIS — D500 Iron deficiency anemia secondary to blood loss (chronic): Secondary | ICD-10-CM

## 2016-04-30 DIAGNOSIS — O039 Complete or unspecified spontaneous abortion without complication: Secondary | ICD-10-CM | POA: Insufficient documentation

## 2016-04-30 MED ORDER — VITAFOL FE+ 90-1-200 & 50 MG PO CPPK: 2.0000 | ORAL_CAPSULE | Freq: Every day | ORAL | Status: DC

## 2016-04-30 MED ORDER — POLYSACCHARIDE IRON COMPLEX 150 MG PO CAPS
150.0000 mg | ORAL_CAPSULE | Freq: Every day | ORAL | Status: DC
Start: 2016-04-30 — End: 2016-05-04

## 2016-05-03 ENCOUNTER — Ambulatory Visit (INDEPENDENT_AMBULATORY_CARE_PROVIDER_SITE_OTHER): Payer: Medicaid Other | Admitting: Obstetrics

## 2016-05-03 ENCOUNTER — Ambulatory Visit: Payer: Self-pay | Admitting: Obstetrics

## 2016-05-03 ENCOUNTER — Encounter: Payer: Self-pay | Admitting: Obstetrics

## 2016-05-03 VITALS — BP 108/67 | HR 66 | Wt 131.0 lb

## 2016-05-03 DIAGNOSIS — O021 Missed abortion: Secondary | ICD-10-CM | POA: Diagnosis not present

## 2016-05-03 NOTE — Progress Notes (Signed)
Patient ID: Melinda Bridges, female   DOB: 10/08/1971, 45 y.o.   MRN: [id number]  Chief Complaint  Patient presents with  . Results    HPI Melinda Bridges is a 45 y.o. female.  Ultrasound findings c/w failed pregnancy.  Patient presents for F/U.  HPI  Past Medical History  Diagnosis Date  . Anxiety   . Depression     Past Surgical History  Procedure Laterality Date  . No past surgeries      History reviewed. No pertinent family history.  Social History Social History  Substance Use Topics  . Smoking status: Never Smoker   . Smokeless tobacco: None  . Alcohol Use: No    No Known Allergies  Current Outpatient Prescriptions  Medication Sig Dispense Refill  . folic acid (FOLVITE) 1 MG tablet Take 4 tablets (4 mg total) by mouth daily. 120 tablet 6  . ibuprofen (ADVIL,MOTRIN) 800 MG tablet Take 1 tablet (800 mg total) by mouth every 8 (eight) hours as needed. 60 tablet 1  . iron polysaccharides (NIFEREX) 150 MG capsule Take 1 capsule (150 mg total) by mouth daily. 30 capsule 5  . oxyCODONE-acetaminophen (PERCOCET/ROXICET) 5-325 MG tablet Take 2 tablets by mouth every 4 (four) hours as needed for severe pain. 25 tablet 0  . Prenat-FePoly-Metf-FA-DHA-DSS (VITAFOL FE+) 90-1-200 & 50 MG CPPK Take 2 tablets by mouth daily. 60 each 12  . Prenatal Vit-Fe Phos-FA-Omega (VITAFOL GUMMIES) 3.33-0.333-34.8 MG CHEW Chew 3 tablets by mouth daily. 90 tablet 12   No current facility-administered medications for this visit.    Review of Systems Review of Systems Constitutional: negative for fatigue and weight loss Respiratory: negative for cough and wheezing Cardiovascular: negative for chest pain, fatigue and palpitations Gastrointestinal: negative for abdominal pain and change in bowel habits Genitourinary:negative Integument/breast: negative for nipple discharge Musculoskeletal:negative for myalgias Neurological: negative for gait problems and tremors Behavioral/Psych: negative  for abusive relationship, depression Endocrine: negative for temperature intolerance     Blood pressure 108/67, pulse 66, weight 131 lb (59.421 kg), unknown if currently breastfeeding.  Physical Exam Physical Exam:  Deferred  100% of 10 min visit spent on counseling and coordination of care.   Data Reviewed Ultrasound Quant. Beta HCG  Assessment     Missed Abortion, 1st trimester     Plan    Patient elects expectant management. F/U in 2 weeks.   Orders Placed This Encounter  Procedures  . Beta HCG, Quant   No orders of the defined types were placed in this encounter.

## 2016-05-04 ENCOUNTER — Ambulatory Visit (HOSPITAL_COMMUNITY): Payer: Medicaid Other

## 2016-05-04 ENCOUNTER — Ambulatory Visit (INDEPENDENT_AMBULATORY_CARE_PROVIDER_SITE_OTHER): Payer: Medicaid Other | Admitting: Obstetrics

## 2016-05-04 ENCOUNTER — Encounter: Payer: Self-pay | Admitting: Obstetrics

## 2016-05-04 ENCOUNTER — Encounter (HOSPITAL_COMMUNITY): Payer: Self-pay

## 2016-05-04 ENCOUNTER — Ambulatory Visit (HOSPITAL_COMMUNITY)
Admission: RE | Admit: 2016-05-04 | Discharge: 2016-05-04 | Disposition: A | Discharge status: Home / Self Care | Payer: Medicaid Other | Source: Ambulatory Visit | Attending: Certified Nurse Midwife | Admitting: Certified Nurse Midwife

## 2016-05-04 VITALS — BP 122/67 | HR 84 | Wt 130.0 lb

## 2016-05-04 DIAGNOSIS — O039 Complete or unspecified spontaneous abortion without complication: Secondary | ICD-10-CM | POA: Insufficient documentation

## 2016-05-04 DIAGNOSIS — O021 Missed abortion: Secondary | ICD-10-CM

## 2016-05-04 DIAGNOSIS — O034 Incomplete spontaneous abortion without complication: Secondary | ICD-10-CM | POA: Diagnosis not present

## 2016-05-04 DIAGNOSIS — D259 Leiomyoma of uterus, unspecified: Secondary | ICD-10-CM | POA: Diagnosis not present

## 2016-05-04 LAB — BETA HCG QUANT (REF LAB): HCG QUANT: 106 m[IU]/mL

## 2016-05-08 ENCOUNTER — Encounter: Payer: Self-pay | Admitting: Obstetrics

## 2016-05-08 NOTE — Progress Notes (Signed)
Patient ID: Melinda Bridges, female   DOB: 03-27-1971, 45 y.o.   MRN: [id number]  Chief Complaint  Patient presents with  . Follow-up    Ultrasound - SAB    HPI Melinda Bridges is a 45 y.o. female.  S/P SAB, complete.  Presents for F/U.  No complaints.  HPI  Past Medical History  Diagnosis Date  . Anxiety   . Depression     Past Surgical History  Procedure Laterality Date  . No past surgeries      History reviewed. No pertinent family history.  Social History Social History  Substance Use Topics  . Smoking status: Never Smoker   . Smokeless tobacco: None  . Alcohol Use: No    No Known Allergies  Current Outpatient Prescriptions  Medication Sig Dispense Refill  . Prenatal Vit-Fe Phos-FA-Omega (VITAFOL GUMMIES) 3.33-0.333-34.8 MG CHEW Chew 3 tablets by mouth daily. 90 tablet 12   No current facility-administered medications for this visit.    Review of Systems Review of Systems Constitutional: negative for fatigue and weight loss Respiratory: negative for cough and wheezing Cardiovascular: negative for chest pain, fatigue and palpitations Gastrointestinal: negative for abdominal pain and change in bowel habits Genitourinary:negative Integument/breast: negative for nipple discharge Musculoskeletal:negative for myalgias Neurological: negative for gait problems and tremors Behavioral/Psych: negative for abusive relationship, depression Endocrine: negative for temperature intolerance     Blood pressure 122/67, pulse 84, weight 130 lb (58.968 kg), unknown if currently breastfeeding.  Physical Exam Physical Exam            General:  Alert and no distress Abdomen:  normal findings: no organomegaly, soft, non-tender and no hernia  Pelvis:  External genitalia: normal general appearance Urinary system: urethral meatus normal and bladder without fullness, nontender Vaginal: normal without tenderness, induration or masses Cervix: normal appearance Adnexa: normal  bimanual exam Uterus: anteverted and non-tender, normal size      Data Reviewed Labs  Assessment     AB, complete.  Doing well.     Plan    F/U 2 weeks.  Repeat Quant   No orders of the defined types were placed in this encounter.   No orders of the defined types were placed in this encounter.

## 2016-05-18 ENCOUNTER — Ambulatory Visit: Payer: Medicaid Other | Admitting: Obstetrics

## 2016-05-31 ENCOUNTER — Ambulatory Visit (HOSPITAL_COMMUNITY): Payer: Medicaid Other

## 2016-10-07 IMAGING — US US TRANSVAGINAL NON-OB
1 series · 15 of 25 positions shown · non-contrast
Comparison: 04/24/2016

CLINICAL DATA: SAB identified on ultrasound 04/24/2016. Patient was
given Cytotec. Quantitative beta HCG on 05/03/2016 was 106. Gravida
6 para 5.

EXAM:
ULTRASOUND PELVIS TRANSVAGINAL
TECHNIQUE: Transvaginal ultrasound examination of the pelvis was performed
including evaluation of the uterus, ovaries, adnexal regions, and
pelvic cul-de-sac.

[Series 1: us transvaginal non-ob · 15 of 33 slices shown]
[im 1/33]
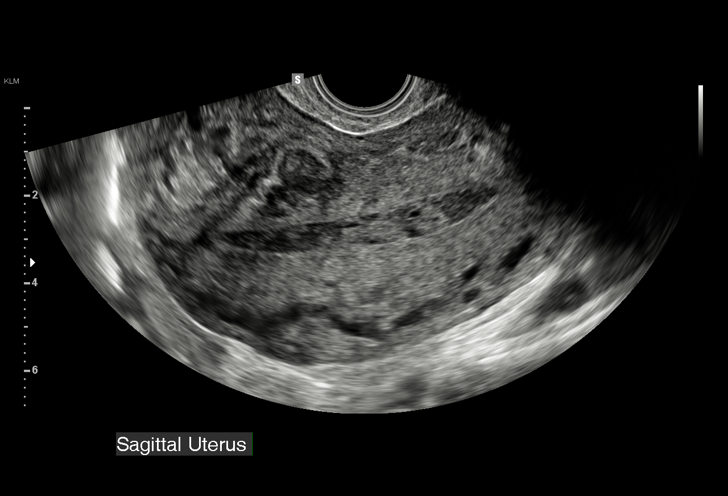
[im 3/33]
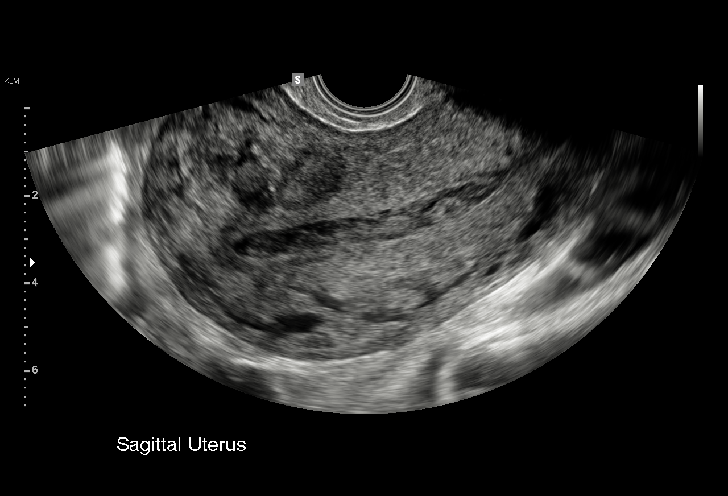
[im 6/33]
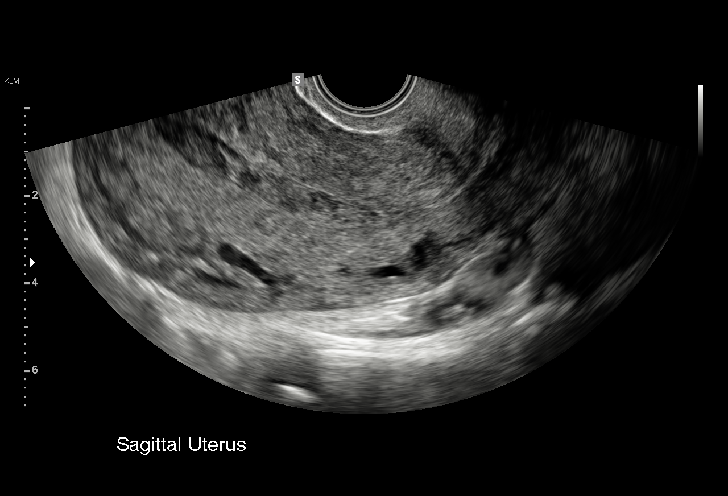
[im 7/33]
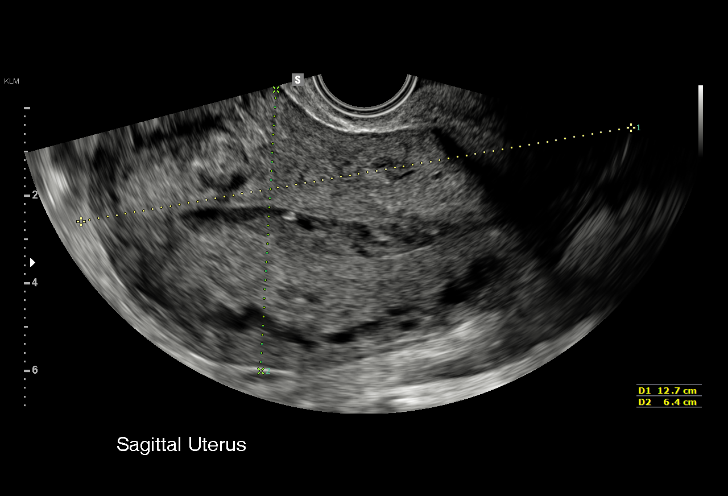
[im 10/33]
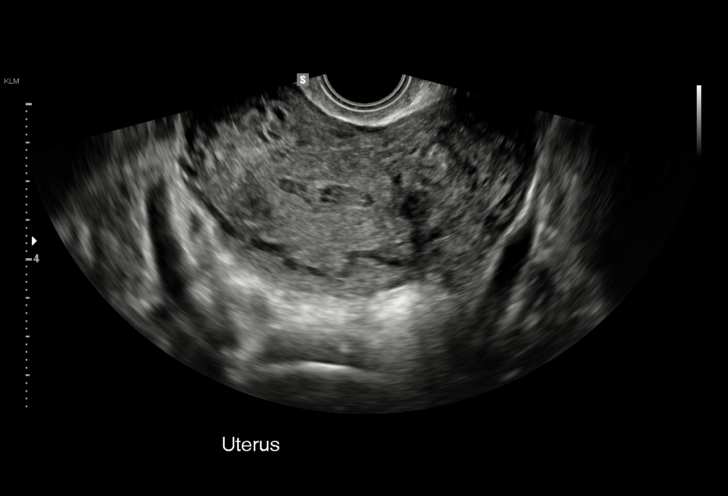
[im 13/33]
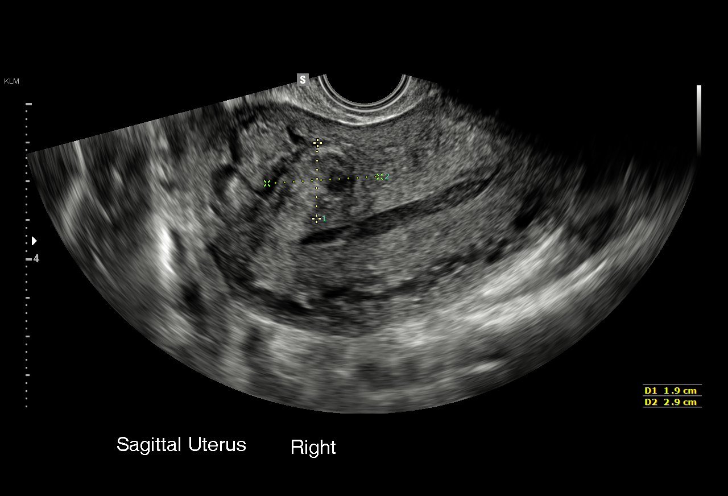
[im 14/33]
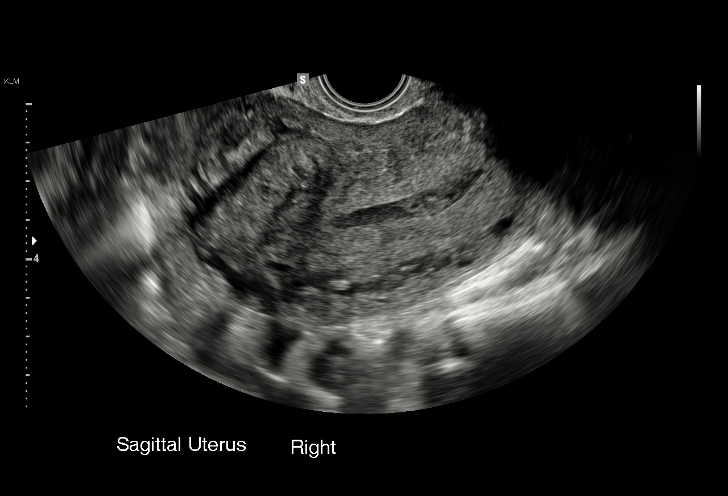
[im 17/33]
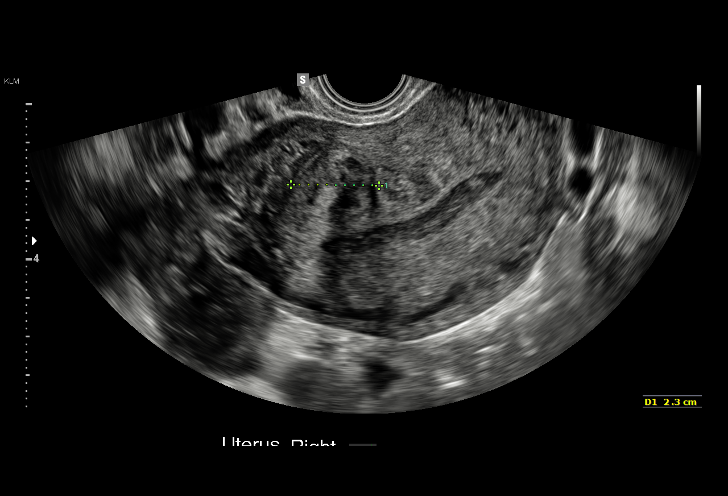
[im 19/33]
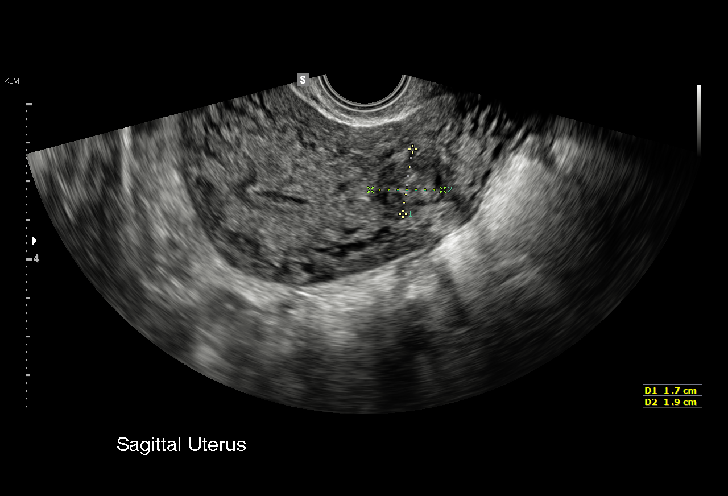
[im 21/33]
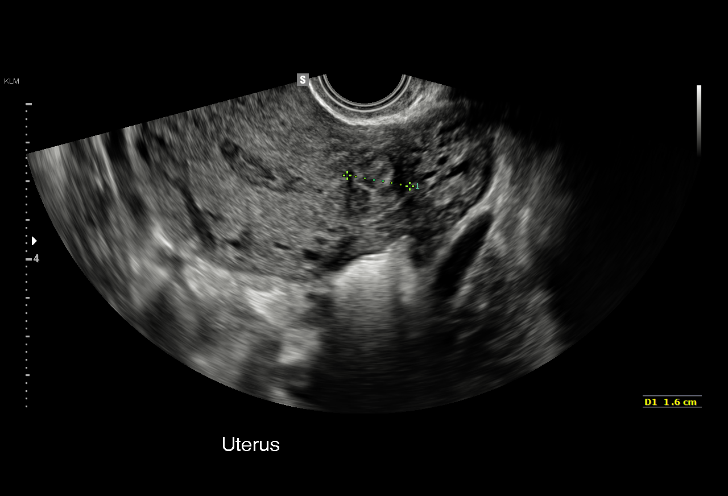
[im 23/33]
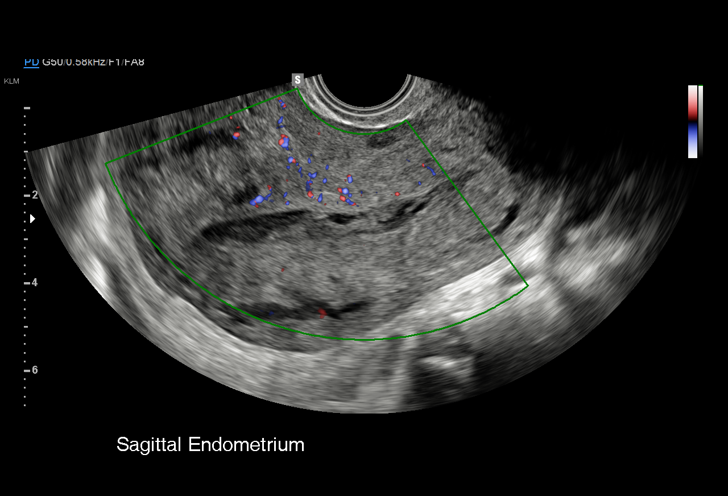
[im 26/33]
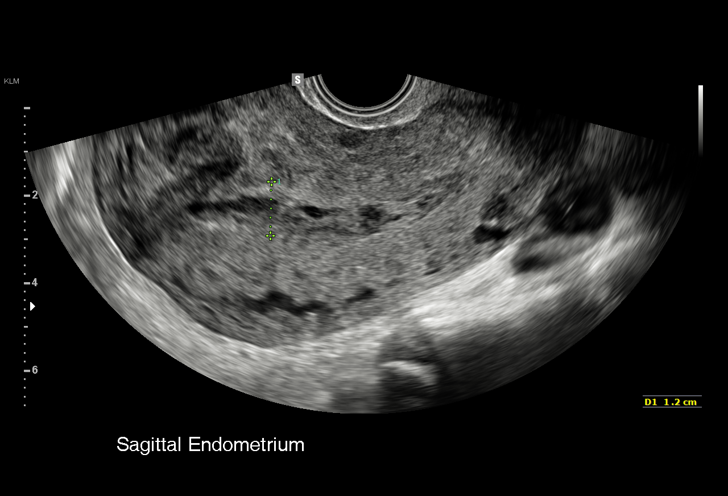
[im 27/33]
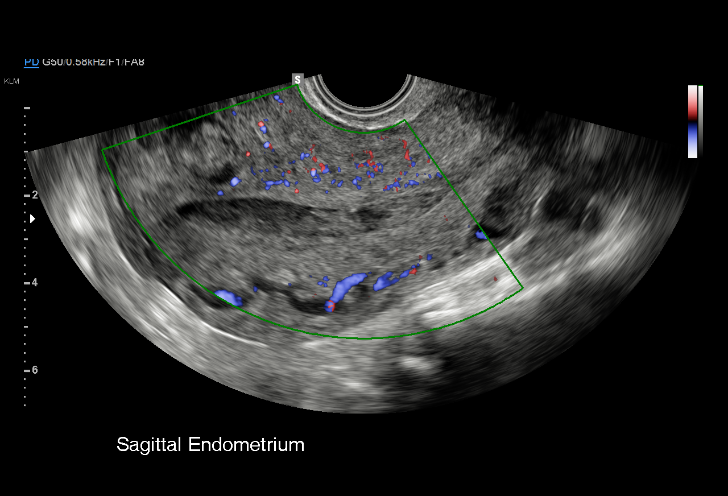
[im 30/33]
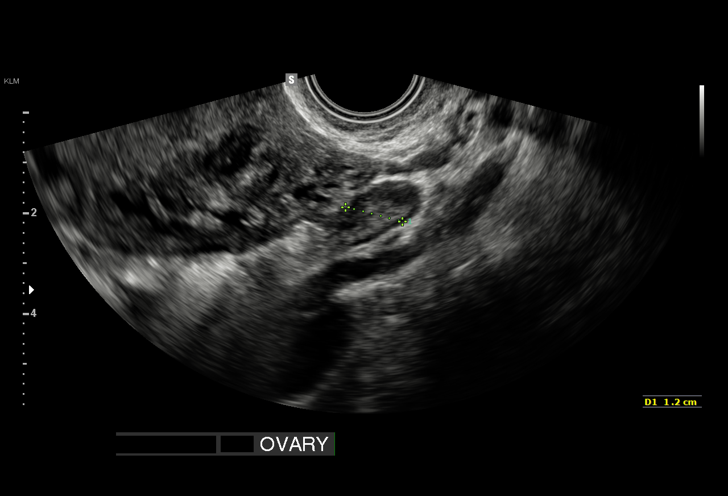
[im 33/33]
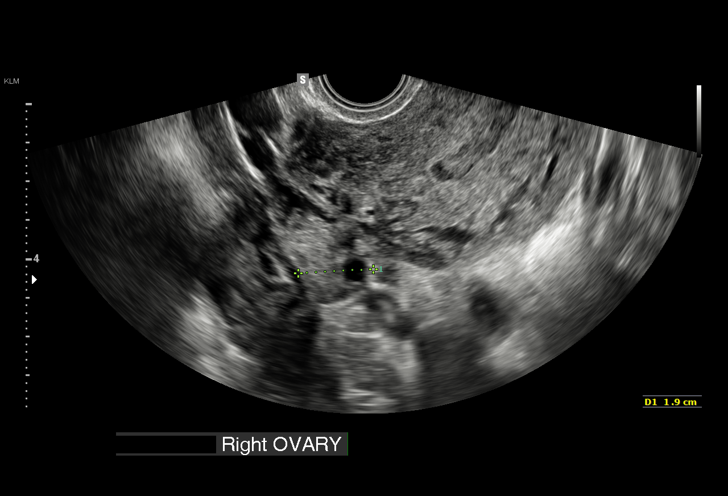

[15 of 25 positions shown; findings below may reference images not displayed]

FINDINGS: Uterus

Measurements: 12.7 x 6.4 x 9.6 cm. Multiple fibroids are present.
Anterior fibroid is 2.9 x 1.9 x 2.4 cm. Right lateral fibroid is
x 2.4 x 2.3 cm. Left lateral fibroid is 1.9 x 1.7 x 1.6 cm.

Endometrium

Thickness: 12.0 mm. There is mildly heterogeneous nonvascular
material within the endometrial canal.

Right ovary

Measurements: 2.3 x 1.8 x 1.9 cm. Normal appearance/no adnexal mass.

Left ovary

Measurements: 2.5 x 1.3 x 1.2 cm. Normal appearance/no adnexal mass.

Other findings:  No abnormal free fluid
IMPRESSION: 1. Heterogeneous material within the endometrial canal is consistent
with retained products of conception or clot in the endometrium. No
discrete intrauterine gestational sac identified.
2. Uterine fibroids are present.
3. Normal appearance of the ovaries.

## 2016-12-31 ENCOUNTER — Encounter (HOSPITAL_COMMUNITY): Payer: Self-pay | Admitting: *Deleted

## 2016-12-31 DIAGNOSIS — R509 Fever, unspecified: Secondary | ICD-10-CM | POA: Diagnosis present

## 2016-12-31 DIAGNOSIS — Z79899 Other long term (current) drug therapy: Secondary | ICD-10-CM | POA: Diagnosis not present

## 2016-12-31 DIAGNOSIS — B349 Viral infection, unspecified: Secondary | ICD-10-CM | POA: Insufficient documentation

## 2016-12-31 NOTE — ED Triage Notes (Signed)
Pt arrives via EMS with c/o flu like symptoms x 2 days including body aches, fever and vomiting. Has taken OTC meds without relief.

## 2017-01-01 ENCOUNTER — Emergency Department (HOSPITAL_COMMUNITY)
Admission: EM | Admit: 2017-01-01 | Discharge: 2017-01-01 | Disposition: A | Discharge status: Home / Self Care | Payer: Medicaid Other | Attending: Emergency Medicine | Admitting: Emergency Medicine

## 2017-01-01 ENCOUNTER — Encounter: Payer: Self-pay | Admitting: Emergency Medicine

## 2017-01-01 DIAGNOSIS — B349 Viral infection, unspecified: Secondary | ICD-10-CM

## 2017-01-01 MED ORDER — BENZONATATE 100 MG PO CAPS: 200.0000 mg | ORAL_CAPSULE | Freq: Two times a day (BID) | ORAL | 0 refills | Status: AC | PRN

## 2017-01-01 MED ORDER — ONDANSETRON 4 MG PO TBDP: 4.0000 mg | ORAL_TABLET | Freq: Three times a day (TID) | ORAL | 0 refills | Status: AC | PRN

## 2017-01-01 MED ORDER — ACETAMINOPHEN 500 MG PO TABS
1000.0000 mg | ORAL_TABLET | Freq: Once | ORAL | Status: AC
  Administered 2017-01-01: 1000 mg via ORAL
  Filled 2017-01-01: qty 2

## 2017-01-01 MED ORDER — OXYMETAZOLINE HCL 0.05 % NA SOLN: 1.0000 | Freq: Two times a day (BID) | NASAL | 0 refills | Status: AC

## 2017-01-01 NOTE — Discharge Instructions (Signed)
Take your medications as prescribed. I also recommend taking Tylenol and ibuprofen as prescribed over-the-counter, oximetry in between doses every 3-4 hours. Continue drinking fluids at home to remain hydrated. I recommend eating a bland diet for the next few days and taper symptoms have improved. Follow-up with your primary care provider in the next 3-4 days if symptoms have not improved. Return to the emergency department if symptoms worsen or new onset of headache, neck stiffness, difficulty breathing, coughing up blood, chest pain, abdominal pain, vomiting, unable to keep fluids down.

## 2017-01-01 NOTE — ED Notes (Signed)
Pt reports body aches for the last 3 days

## 2017-01-01 NOTE — ED Provider Notes (Signed)
[facility] DEPT Provider Note   CSN: [id number] Arrival date & time: 12/31/16  2321     History   Chief Complaint Chief Complaint  Patient presents with  . Flu like symptoms    HPI Melinda Bridges is a 46 y.o. female.  HPI   Patient is a 46 year old female who presents the ED with complaint of flulike symptoms, onset 3 days. Patient reports having worsening chills, generalized body aches, subjective fever, nasal congestion, sore throat, cough, chest tightness, N/V. Reports taking over-the-counter TheraFlu without relief. Patient reports family members are sick with similar symptoms at home. Patient states she has been able to tolerate fluids today. Denies chest pain, shortness of breath, hematemesis, hemoptysis, abdominal pain, diarrhea, urinary symptoms. Denies any recent antibiotic use.  Past Medical History:  Diagnosis Date  . Anxiety   . Depression     Patient Active Problem List   Diagnosis Date Noted  . SAB (spontaneous abortion) 04/30/2016  . Major depressive disorder, recurrent episode, moderate ([place]) 06/08/2013  . Mood disorder ([place]) 06/04/2013    Past Surgical History:  Procedure Laterality Date  . NO PAST SURGERIES      OB History    Gravida Para Term Preterm AB Living   6 5 5   1 5    SAB TAB Ectopic Multiple Live Births   1               Home Medications    Prior to Admission medications   Medication Sig Start Date End Date Taking? Authorizing Provider  benzonatate (TESSALON) 100 MG capsule Take 2 capsules (200 mg total) by mouth 2 (two) times daily as needed for cough. 01/01/17   Nona Dell, PA-C  ondansetron (ZOFRAN ODT) 4 MG disintegrating tablet Take 1 tablet (4 mg total) by mouth every 8 (eight) hours as needed for nausea or vomiting. 01/01/17   Nona Dell, PA-C  oxymetazoline (AFRIN NASAL SPRAY) 0.05 % nasal spray Place 1 spray into both nostrils 2 (two) times daily. 01/01/17   Nona Dell, PA-C    Prenatal Vit-Fe Phos-FA-Omega (VITAFOL GUMMIES) 3.33-0.333-34.8 MG CHEW Chew 3 tablets by mouth daily. 03/31/16   Morene Crocker, CNM    Family History No family history on file.  Social History Social History  Substance Use Topics  . Smoking status: Never Smoker  . Smokeless tobacco: Not on file  . Alcohol use No     Allergies   Patient has no known allergies.   Review of Systems Review of Systems  Constitutional: Positive for chills and fever (subjective).  HENT: Positive for congestion and sore throat.   Respiratory: Positive for cough and chest tightness.   Gastrointestinal: Positive for nausea and vomiting.  Musculoskeletal: Positive for myalgias (generalized).  All other systems reviewed and are negative.    Physical Exam Updated Vital Signs BP 99/64 (BP Location: Left Arm)   Pulse 78   Temp 100.7 F (38.2 C) (Oral)   Resp 20   LMP 12/29/2016   SpO2 100%   Physical Exam  Constitutional: She is oriented to person, place, and time. She appears well-developed and well-nourished. No distress.  HENT:  Head: Normocephalic and atraumatic.  Right Ear: A middle ear effusion is present.  Left Ear: A middle ear effusion is present.  Nose: Rhinorrhea present. Right sinus exhibits no maxillary sinus tenderness and no frontal sinus tenderness. Left sinus exhibits no maxillary sinus tenderness and no frontal sinus tenderness.  Mouth/Throat: Uvula is midline, oropharynx is clear  and moist and mucous membranes are normal. No oropharyngeal exudate, posterior oropharyngeal edema, posterior oropharyngeal erythema or tonsillar abscesses. No tonsillar exudate.  Eyes: Conjunctivae and EOM are normal. Right eye exhibits no discharge. Left eye exhibits no discharge. No scleral icterus.  Neck: Normal range of motion. Neck supple.  Cardiovascular: Normal rate, regular rhythm, normal heart sounds and intact distal pulses.   Pulmonary/Chest: Effort normal and breath sounds normal. No  respiratory distress. She has no wheezes. She has no rales. She exhibits no tenderness.  Abdominal: Soft. Bowel sounds are normal. She exhibits no distension and no mass. There is no tenderness. There is no rebound and no guarding. No hernia.  Musculoskeletal: Normal range of motion. She exhibits no edema.  Lymphadenopathy:    She has no cervical adenopathy.  Neurological: She is alert and oriented to person, place, and time.  Skin: Skin is warm and dry. She is not diaphoretic.  Nursing note and vitals reviewed.    ED Treatments / Results  Labs (all labs ordered are listed, but only abnormal results are displayed) Labs Reviewed - No data to display  EKG  EKG Interpretation None       Radiology No results found.  Procedures Procedures (including critical care time)  Medications Ordered in ED Medications  acetaminophen (TYLENOL) tablet 1,000 mg (not administered)     Initial Impression / Assessment and Plan / ED Course  I have reviewed the triage vital signs and the nursing notes.  Pertinent labs & imaging results that were available during my care of the patient were reviewed by me and considered in my medical decision making (see chart for details).     Patient with symptoms consistent with influenza.  Vitals are stable, low-grade fever.  No signs of dehydration, tolerating PO's.  Lungs are clear. Due to patient's presentation and physical exam a chest x-ray was not ordered bc likely diagnosis of flu.  Discussed the cost versus benefit of Tamiflu treatment with the patient.  The patient understands that symptoms are greater than the recommended 24-48 hour window of treatment.  Patient will be discharged with instructions to orally hydrate, rest, and use over-the-counter medications such as anti-inflammatories ibuprofen and Aleve for muscle aches and Tylenol for fever.  Patient will also be given a cough suppressant.    Final Clinical Impressions(s) / ED Diagnoses   Final  diagnoses:  Viral illness    New Prescriptions New Prescriptions   BENZONATATE (TESSALON) 100 MG CAPSULE    Take 2 capsules (200 mg total) by mouth 2 (two) times daily as needed for cough.   ONDANSETRON (ZOFRAN ODT) 4 MG DISINTEGRATING TABLET    Take 1 tablet (4 mg total) by mouth every 8 (eight) hours as needed for nausea or vomiting.   OXYMETAZOLINE (AFRIN NASAL SPRAY) 0.05 % NASAL SPRAY    Place 1 spray into both nostrils 2 (two) times daily.     Chesley Noon [place], [place] 01/01/17 [id number]    April Palumbo, MD 01/01/17 [phone]

## 2018-06-11 ENCOUNTER — Ambulatory Visit: Payer: Medicaid Other

## 2018-06-11 VITALS — BP 124/73 | HR 54 | Wt 118.4 lb

## 2018-06-11 DIAGNOSIS — Z3201 Encounter for pregnancy test, result positive: Secondary | ICD-10-CM

## 2018-06-11 DIAGNOSIS — N912 Amenorrhea, unspecified: Secondary | ICD-10-CM

## 2018-06-11 LAB — POCT URINE PREGNANCY: PREG TEST UR: POSITIVE — AB

## 2018-06-11 NOTE — Progress Notes (Signed)
Pt is here for UPT. Pt states she is unsure of exact LMP but thinks it may have been on or around 05/22/18. UPT positive, pt advised to schedule new OB appointment for around [redacted] weeks gestation, pt verbalized understanding.

## 2018-06-17 ENCOUNTER — Other Ambulatory Visit: Payer: Self-pay

## 2018-06-17 ENCOUNTER — Encounter (HOSPITAL_COMMUNITY): Payer: Self-pay | Admitting: *Deleted

## 2018-06-17 ENCOUNTER — Telehealth: Payer: Self-pay

## 2018-06-17 ENCOUNTER — Inpatient Hospital Stay (HOSPITAL_COMMUNITY): Payer: Self-pay

## 2018-06-17 ENCOUNTER — Inpatient Hospital Stay (HOSPITAL_COMMUNITY)
Admission: AD | Admit: 2018-06-17 | Discharge: 2018-06-17 | Disposition: A | Discharge status: Home / Self Care | Payer: Self-pay | Source: Ambulatory Visit | Attending: Obstetrics and Gynecology | Admitting: Obstetrics and Gynecology

## 2018-06-17 DIAGNOSIS — Z3491 Encounter for supervision of normal pregnancy, unspecified, first trimester: Secondary | ICD-10-CM

## 2018-06-17 DIAGNOSIS — O4690 Antepartum hemorrhage, unspecified, unspecified trimester: Secondary | ICD-10-CM

## 2018-06-17 DIAGNOSIS — Z87891 Personal history of nicotine dependence: Secondary | ICD-10-CM | POA: Insufficient documentation

## 2018-06-17 DIAGNOSIS — Z3A01 Less than 8 weeks gestation of pregnancy: Secondary | ICD-10-CM | POA: Insufficient documentation

## 2018-06-17 DIAGNOSIS — O034 Incomplete spontaneous abortion without complication: Secondary | ICD-10-CM | POA: Insufficient documentation

## 2018-06-17 LAB — CBC WITH DIFFERENTIAL/PLATELET
Basophils Absolute: 0 10*3/uL (ref 0.0–0.1)
Basophils Relative: 0 %
EOS ABS: 0.1 10*3/uL (ref 0.0–0.7)
EOS PCT: 1 %
HCT: 32.3 % — ABNORMAL LOW (ref 36.0–46.0)
HEMOGLOBIN: 9.5 g/dL — AB (ref 12.0–15.0)
Lymphocytes Relative: 21 %
Lymphs Abs: 2.5 10*3/uL (ref 0.7–4.0)
MCH: 18.1 pg — AB (ref 26.0–34.0)
MCHC: 29.4 g/dL — AB (ref 30.0–36.0)
MCV: 61.5 fL — ABNORMAL LOW (ref 78.0–100.0)
MONOS PCT: 4 %
Monocytes Absolute: 0.4 10*3/uL (ref 0.1–1.0)
NEUTROS PCT: 74 %
Neutro Abs: 9.1 10*3/uL — ABNORMAL HIGH (ref 1.7–7.7)
PLATELETS: 323 10*3/uL (ref 150–400)
RBC: 5.25 MIL/uL — AB (ref 3.87–5.11)
RDW: 21.9 % — ABNORMAL HIGH (ref 11.5–15.5)
WBC: 12.2 10*3/uL — AB (ref 4.0–10.5)

## 2018-06-17 LAB — COMPREHENSIVE METABOLIC PANEL
ALBUMIN: 4.2 g/dL (ref 3.5–5.0)
ALK PHOS: 38 U/L (ref 38–126)
ALT: 28 U/L (ref 0–44)
ANION GAP: 9 (ref 5–15)
AST: 33 U/L (ref 15–41)
BUN: 12 mg/dL (ref 6–20)
CALCIUM: 9.8 mg/dL (ref 8.9–10.3)
CHLORIDE: 101 mmol/L (ref 98–111)
CO2: 25 mmol/L (ref 22–32)
Creatinine, Ser: 0.74 mg/dL (ref 0.44–1.00)
GFR calc non Af Amer: >60 mL/min (ref >60)
Glucose, Bld: 98 mg/dL (ref 70–99)
POTASSIUM: 4.9 mmol/L (ref 3.5–5.1)
SODIUM: 135 mmol/L (ref 135–145)
Total Bilirubin: 0.3 mg/dL (ref 0.3–1.2)
Total Protein: 7.5 g/dL (ref 6.5–8.1)

## 2018-06-17 LAB — HCG, QUANTITATIVE, PREGNANCY: hCG, Beta Chain, Quant, S: 1879 m[IU]/mL — ABNORMAL HIGH (ref <5)

## 2018-06-17 MED ORDER — IBUPROFEN 800 MG PO TABS: 800.0000 mg | ORAL_TABLET | Freq: Three times a day (TID) | ORAL | 0 refills | Status: AC

## 2018-06-17 MED ORDER — HYDROCODONE-ACETAMINOPHEN 5-325 MG PO TABS: 1.0000 | ORAL_TABLET | Freq: Four times a day (QID) | ORAL | 0 refills | Status: AC | PRN

## 2018-06-17 MED ORDER — MISOPROSTOL 200 MCG PO TABS: 600.0000 ug | ORAL_TABLET | Freq: Once | ORAL | 0 refills | Status: AC

## 2018-06-17 NOTE — MAU Provider Note (Signed)
History     CSN: [id number]  Arrival date and time: 06/17/18 1156   None     Chief Complaint  Patient presents with  . Vaginal Bleeding   HPI   Melinda Bridges is a 47 y.o. female [phone] @ Unknown here in MAU with complaints of vaginal bleeding. Patient says she thinks she is about 11-[redacted] weeks pregnant. The bleeding started today. The bleeding is menstrual like. The bleeding is constant. Nothing makes the bleeding worse. The bleeding is similar to her menstrual cycle. She has no associated pain with the bleeding.   OB History    Gravida  7   Para  5   Term  5   Preterm      AB  1   Living  5     SAB  1   TAB      Ectopic      Multiple      Live Births  5           Past Medical History:  Diagnosis Date  . Anxiety    little unsettled, anxiety  . Depression    pk now    Past Surgical History:  Procedure Laterality Date  . NO PAST SURGERIES      Family History  Problem Relation Age of Onset  . Diabetes Mother   . Hypertension Maternal Grandmother   . Diabetes Maternal Grandfather   . Hypertension Maternal Grandfather     Social History   Tobacco Use  . Smoking status: Former Smoker    Types: Cigarettes  . Smokeless tobacco: Never Used  . Tobacco comment: quit Jan 2019  Substance Use Topics  . Alcohol use: No    Alcohol/week: 0.0 oz  . Drug use: No    Allergies: No Known Allergies  Medications Prior to Admission  Medication Sig Dispense Refill Last Dose  . benzonatate (TESSALON) 100 MG capsule Take 2 capsules (200 mg total) by mouth 2 (two) times daily as needed for cough. 20 capsule 0   . ondansetron (ZOFRAN ODT) 4 MG disintegrating tablet Take 1 tablet (4 mg total) by mouth every 8 (eight) hours as needed for nausea or vomiting. 10 tablet 0   . oxymetazoline (AFRIN NASAL SPRAY) 0.05 % nasal spray Place 1 spray into both nostrils 2 (two) times daily. 30 mL 0   . Prenatal Vit-Fe Phos-FA-Omega (VITAFOL GUMMIES) 3.33-0.333-34.8 MG  CHEW Chew 3 tablets by mouth daily. 90 tablet 12 Taking   Results for orders placed or performed during the hospital encounter of 06/17/18 (from the past 24 hour(s))  CBC with Differential/Platelet     Status: Abnormal   Collection Time: 06/17/18  2:09 PM  Result Value Ref Range   WBC 12.2 (H) 4.0 - 10.5 K/uL   RBC 5.25 (H) 3.87 - 5.11 MIL/uL   Hemoglobin 9.5 (L) 12.0 - 15.0 g/dL   HCT 32.3 (L) 36.0 - 46.0 %   MCV 61.5 (L) 78.0 - 100.0 fL   MCH 18.1 (L) 26.0 - 34.0 pg   MCHC 29.4 (L) 30.0 - 36.0 g/dL   RDW 21.9 (H) 11.5 - 15.5 %   Platelets 323 150 - 400 K/uL   Neutrophils Relative % 74 %   Neutro Abs 9.1 (H) 1.7 - 7.7 K/uL   Lymphocytes Relative 21 %   Lymphs Abs 2.5 0.7 - 4.0 K/uL   Monocytes Relative 4 %   Monocytes Absolute 0.4 0.1 - 1.0 K/uL   Eosinophils Relative 1 %  Eosinophils Absolute 0.1 0.0 - 0.7 K/uL   Basophils Relative 0 %   Basophils Absolute 0.0 0.0 - 0.1 K/uL  Comprehensive metabolic panel     Status: None   Collection Time: 06/17/18  2:09 PM  Result Value Ref Range   Sodium 135 135 - 145 mmol/L   Potassium 4.9 3.5 - 5.1 mmol/L   Chloride 101 98 - 111 mmol/L   CO2 25 22 - 32 mmol/L   Glucose, Bld 98 70 - 99 mg/dL   BUN 12 6 - 20 mg/dL   Creatinine, Ser 0.74 0.44 - 1.00 mg/dL   Calcium 9.8 8.9 - 10.3 mg/dL   Total Protein 7.5 6.5 - 8.1 g/dL   Albumin 4.2 3.5 - 5.0 g/dL   AST 33 15 - 41 U/L   ALT 28 0 - 44 U/L   Alkaline Phosphatase 38 38 - 126 U/L   Total Bilirubin 0.3 0.3 - 1.2 mg/dL   GFR calc non Af Amer >60 >60 mL/min   GFR calc Af Amer >60 >60 mL/min   Anion gap 9 5 - 15  hCG, quantitative, pregnancy     Status: Abnormal   Collection Time: 06/17/18  2:09 PM  Result Value Ref Range   hCG, Beta Chain, Quant, S 1,879 (H) <5 mIU/mL    US Ob Comp Less 14 Wks  Result Date: 06/17/2018 CLINICAL DATA:  Pregnant, beta HCG 1879 EXAM: OBSTETRIC <14 WK ULTRASOUND TECHNIQUE: Transabdominal ultrasound was performed for evaluation of the gestation as well as  the maternal uterus and adnexal regions. COMPARISON:  None. FINDINGS: Intrauterine gestational sac: Single Yolk sac:  Not Visualized. Embryo:  Visualized. Cardiac Activity: Not Visualized. CRL:   10.5 mm   7 w 1 d Subchorionic hemorrhage:  None visualized. Maternal uterus/adnexae: Uterine fibroids, including a dominant 2.7 cm intramural fibroid in the left anterior uterine body and a 1.7 cm subserosal fibroid in the posterior lower uterine segment. Bilateral ovaries are within normal limits. No free fluid. IMPRESSION: Single intrauterine gestation, measuring 7 weeks 1 day by crown-rump length, without cardiac activity. Findings meet definitive criteria for failed pregnancy. This follows SRU consensus guidelines: Diagnostic Criteria for Nonviable Pregnancy Early in the First Trimester. Alison Stalling J Med [phone]. These results will be called to the ordering clinician or representative by the Radiologist Assistant, and communication documented in the PACS or zVision Dashboard. Electronically Signed   By: Julian Hy M.D.   On: 06/17/2018 15:49   Review of Systems  Constitutional: Negative for fever.  Gastrointestinal: Negative for abdominal pain.  Genitourinary: Positive for vaginal bleeding.  Neurological: Negative for dizziness and headaches.   Physical Exam   Blood pressure 117/67, pulse 71, temperature 98.5 F (36.9 C), temperature source Oral, resp. rate 18, weight 120 lb 12.8 oz (54.8 kg), last menstrual period 05/22/2018, SpO2 100 %, unknown if currently breastfeeding.  Physical Exam  Constitutional: She is oriented to person, place, and time. She appears well-developed and well-nourished. No distress.  HENT:  Head: Normocephalic.  Eyes: Pupils are equal, round, and reactive to light.  Respiratory: Effort normal.  GI: Soft. She exhibits no distension. There is no tenderness. There is no rebound and no guarding.  Musculoskeletal: Normal range of motion.  Neurological: She is alert  and oriented to person, place, and time.  Skin: Skin is warm. She is not diaphoretic.  Psychiatric: Her behavior is normal.    MAU Course  Procedures  None  MDM  A positive blood type.  Offered expectant, medical and surgical management, discussed risks/benefits of each. Reviewed that she does not have to make a decision today.  Early Intrauterine Pregnancy Failure Protocol X  Documented intrauterine pregnancy failure less than or equal to [redacted] weeks   gestation  X  No serious current illness  X  Baseline Hgb greater than or equal to 10g/dl,  hgb 9.5. Patient asymptomatic.  X  Patient has easily accessible transportation to the hospital  X  Clear preference  X  Practitioner/physician deems patient reliable  X  Counseling by practitioner or physician  X  Patient education by RN  X  Consent form signed  NA     Rho-Gam given by RN if indicated  X  Medication dispensed  X  Cytotec 600 mcg buccal by patient at home       Intravaginally by NP in MAU       Rectally by patient at home       Rectally by RN in MAU  X   Ibuprofen 600 mg 1 tablet by mouth every 6 hours as needed #30 - prescribed  X   vicodin by mouth every 4 to 6 hours as needed - prescribed    Reviewed with pt cytotec procedure.  Pt verbalizes that she lives close to the hospital and has transportation readily available.  Pt appears reliable and verbalizes understanding and agrees with plan of care  Assessment and Plan   A:  1. Incomplete miscarriage   2. Vaginal bleeding in pregnancy   3. Currently pregnant in first trimester with unknown gestational age   73. [redacted] weeks gestation of pregnancy     P:  Discharge home with strict return precautions Rx: Cytotec, ibuprofen, vicodin Bleeding precautions Message sent to the [facility] for F/U in 2 weeks.  Support given Return to MAU if symptoms worsen  Rasch, Anderson Malta I, NP 06/17/2018 5:00 PM

## 2018-06-17 NOTE — Telephone Encounter (Signed)
Patient called stating that she is spotting/bleeding.  Her NOB visit is scheduled for 07/16/18. She was advised to go to the [facility] for evaluation.  She verbalized agreement and understanding.

## 2018-06-17 NOTE — MAU Note (Signed)
Woke up this morning, used the bathroom and saw blood, changed once- wasn't soaked.  Started out with a teeny little cramp, only lasted a 2nd or two.  No pain now.

## 2018-06-17 NOTE — MAU Note (Signed)
Urine in lab 

## 2018-06-17 NOTE — Discharge Instructions (Signed)

## 2018-06-17 NOTE — MAU Note (Signed)
Pt feels she is further along.  By LMP would have been 3 wks when learned she was preg.

## 2018-07-16 ENCOUNTER — Ambulatory Visit: Payer: Self-pay | Admitting: Obstetrics and Gynecology

## 2018-11-20 IMAGING — US US OB COMP LESS 14 WK
1 series · 15 of 28 positions shown · non-contrast
Comparison: None.

CLINICAL DATA: Pregnant, beta HCG 6231

EXAM:
OBSTETRIC <14 WK ULTRASOUND
TECHNIQUE: Transabdominal ultrasound was performed for evaluation of the
gestation as well as the maternal uterus and adnexal regions.

[Series 1: us ob comp less 14 wk · 32 acquisitions, 15 frames shown]
[im 1/32]
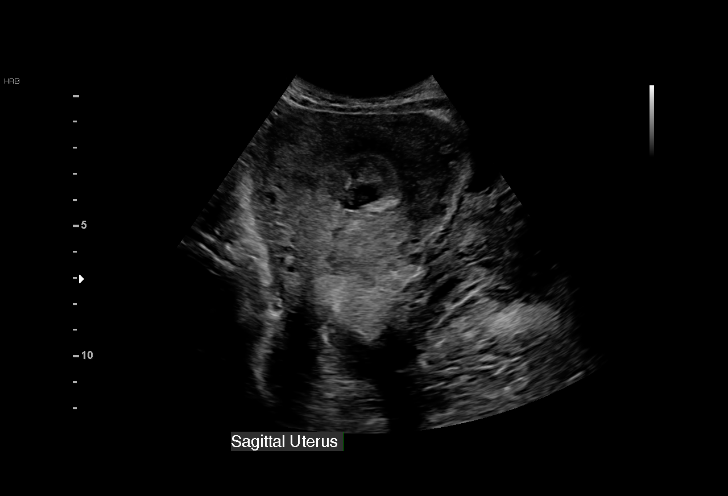
[im 3/32]
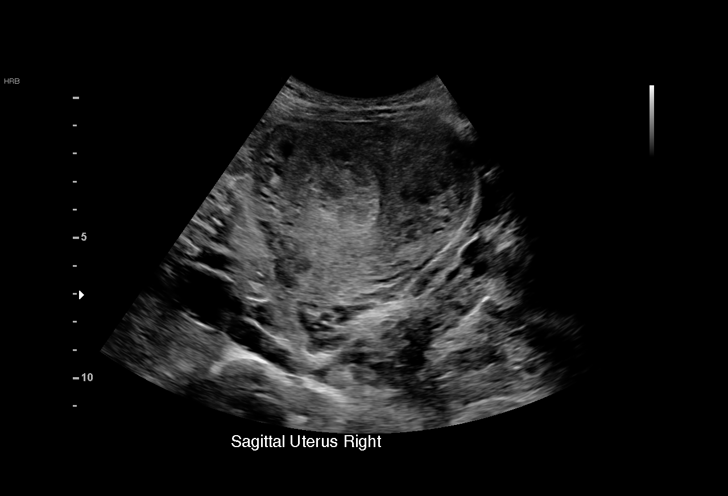
[im 5/32]
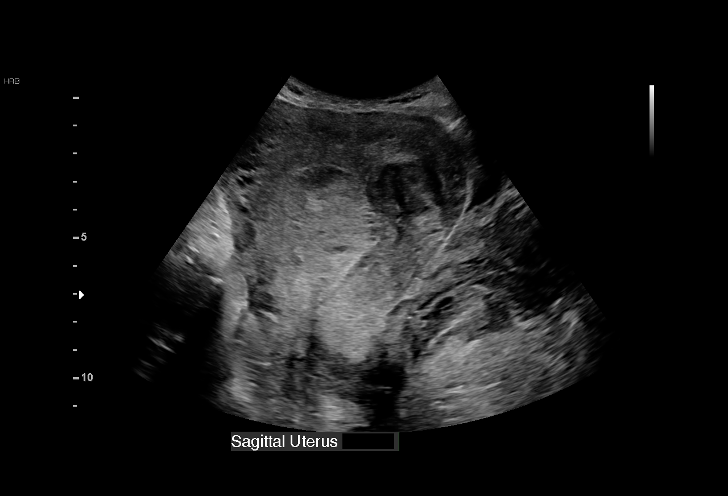
[im 7/32]
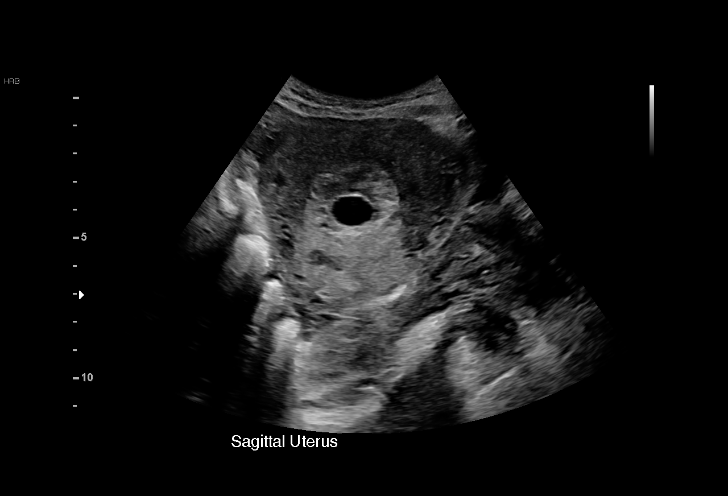
[im 10/32]
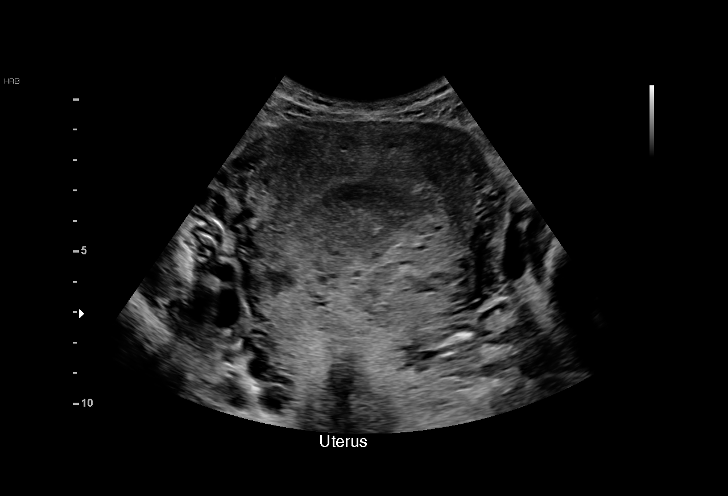
[im 12/32]
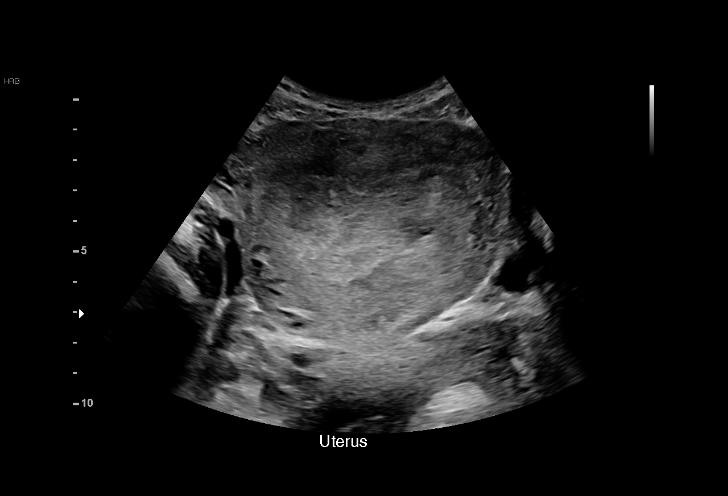
[im 14/32]
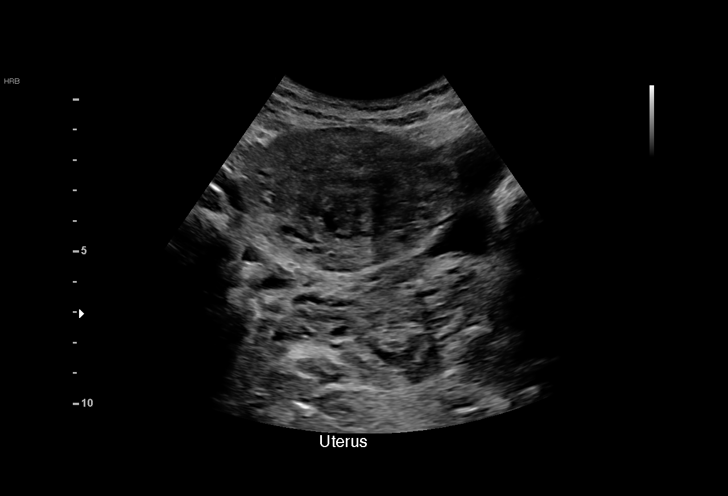
[im 17/32]
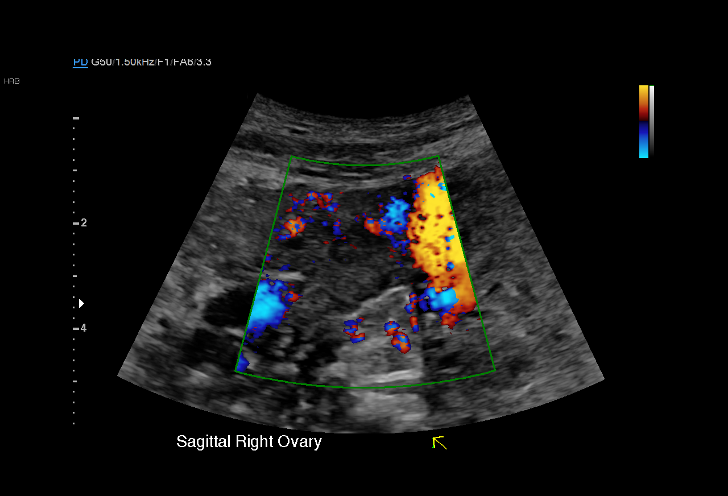
[im 18/32]
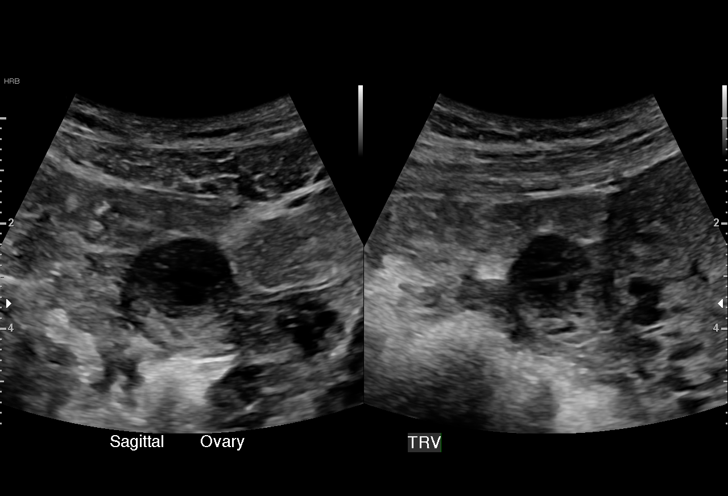
[im 20/32]
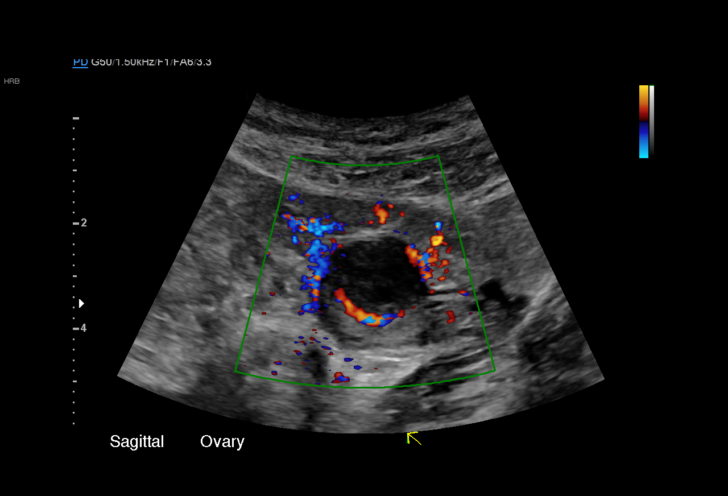
[im 22/32]
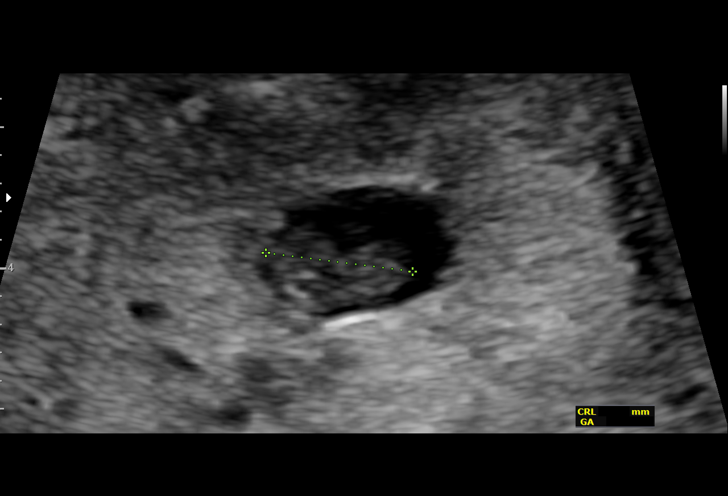
[im 25/32]
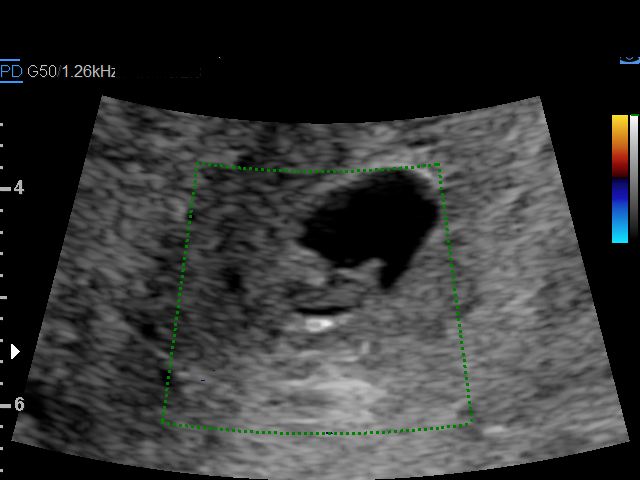
[im 27/32]
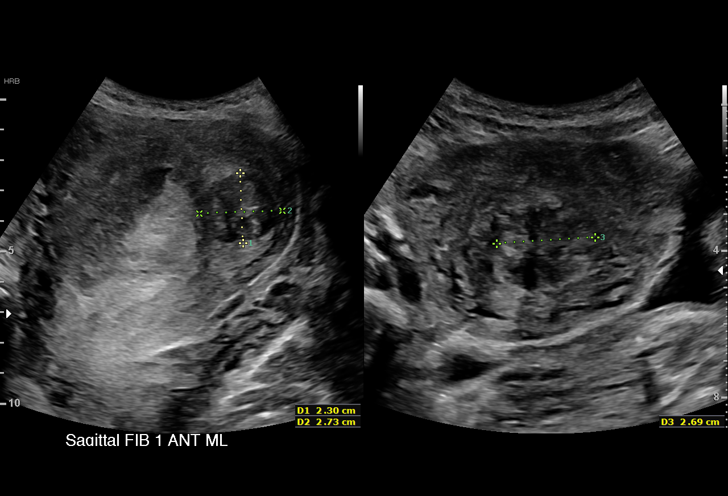
[im 29/32]
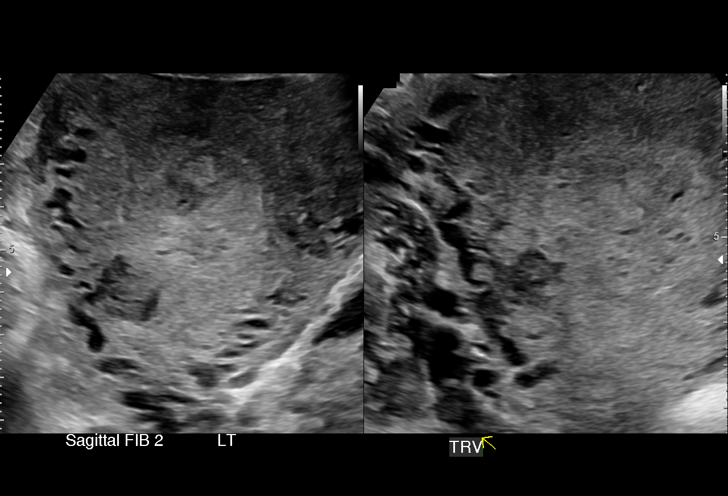
[im 32/32]
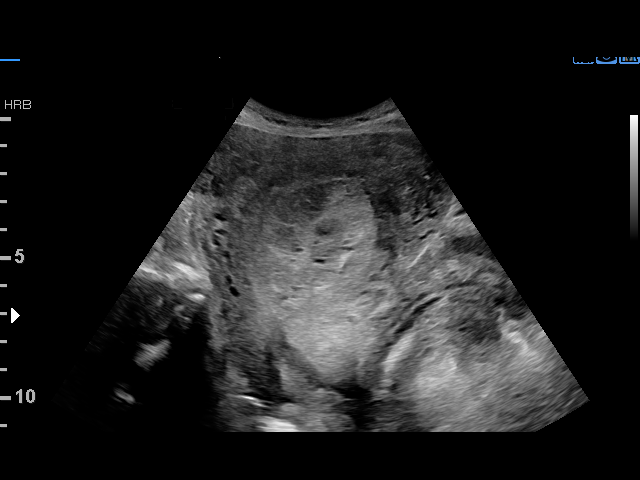

[15 of 28 positions shown; findings below may reference images not displayed]

FINDINGS: Intrauterine gestational sac: Single

Yolk sac:  Not Visualized.

Embryo:  Visualized.

Cardiac Activity: Not Visualized.

CRL:   10.5 mm   7 w 1 d

Subchorionic hemorrhage:  None visualized.

Maternal uterus/adnexae: Uterine fibroids, including a dominant
cm intramural fibroid in the left anterior uterine body and a 1.7 cm
subserosal fibroid in the posterior lower uterine segment.

Bilateral ovaries are within normal limits.

No free fluid.
IMPRESSION: Single intrauterine gestation, measuring 7 weeks 1 day by crown-rump
length, without cardiac activity. Findings meet definitive criteria
for failed pregnancy. This follows SRU consensus guidelines:
Diagnostic Criteria for Nonviable Pregnancy Early in the First
Trimester. N Engl J Med 2376;[DATE].

These results will be called to the ordering clinician or
representative by the Radiologist Assistant, and communication
documented in the PACS or zVision Dashboard.

## 2019-06-16 ENCOUNTER — Encounter (HOSPITAL_COMMUNITY): Payer: Self-pay

## 2020-12-04 ENCOUNTER — Other Ambulatory Visit: Payer: Self-pay

## 2020-12-04 DIAGNOSIS — Z87891 Personal history of nicotine dependence: Secondary | ICD-10-CM | POA: Insufficient documentation

## 2020-12-04 DIAGNOSIS — M791 Myalgia, unspecified site: Secondary | ICD-10-CM | POA: Diagnosis not present

## 2020-12-04 DIAGNOSIS — R112 Nausea with vomiting, unspecified: Secondary | ICD-10-CM | POA: Insufficient documentation

## 2020-12-04 DIAGNOSIS — Z20822 Contact with and (suspected) exposure to covid-19: Secondary | ICD-10-CM | POA: Insufficient documentation

## 2020-12-04 DIAGNOSIS — R252 Cramp and spasm: Secondary | ICD-10-CM | POA: Diagnosis present

## 2020-12-05 ENCOUNTER — Encounter (HOSPITAL_COMMUNITY): Payer: Self-pay

## 2020-12-05 ENCOUNTER — Emergency Department (HOSPITAL_COMMUNITY)
Admission: EM | Admit: 2020-12-05 | Discharge: 2020-12-05 | Disposition: A | Discharge status: Home / Self Care | Payer: Medicaid Other | Attending: Emergency Medicine | Admitting: Emergency Medicine

## 2020-12-05 DIAGNOSIS — M791 Myalgia, unspecified site: Secondary | ICD-10-CM

## 2020-12-05 LAB — URINALYSIS, ROUTINE W REFLEX MICROSCOPIC
Bilirubin Urine: NEGATIVE
Glucose, UA: NEGATIVE mg/dL
Hgb urine dipstick: NEGATIVE
Ketones, ur: NEGATIVE mg/dL
Leukocytes,Ua: NEGATIVE
Nitrite: NEGATIVE
Protein, ur: NEGATIVE mg/dL
Specific Gravity, Urine: 1.016 (ref 1.005–1.030)
pH: 7 (ref 5.0–8.0)

## 2020-12-05 LAB — CBC WITH DIFFERENTIAL/PLATELET
Abs Immature Granulocytes: 0.03 10*3/uL (ref 0.00–0.07)
Basophils Absolute: 0.1 10*3/uL (ref 0.0–0.1)
Basophils Relative: 1 %
Eosinophils Absolute: 0.1 10*3/uL (ref 0.0–0.5)
Eosinophils Relative: 1 %
HCT: 30.7 % — ABNORMAL LOW (ref 36.0–46.0)
Hemoglobin: 8.8 g/dL — ABNORMAL LOW (ref 12.0–15.0)
Immature Granulocytes: 0 %
Lymphocytes Relative: 25 %
Lymphs Abs: 2.4 10*3/uL (ref 0.7–4.0)
MCH: 17.4 pg — ABNORMAL LOW (ref 26.0–34.0)
MCHC: 28.7 g/dL — ABNORMAL LOW (ref 30.0–36.0)
MCV: 60.6 fL — ABNORMAL LOW (ref 80.0–100.0)
Monocytes Absolute: 0.8 10*3/uL (ref 0.1–1.0)
Monocytes Relative: 9 %
Neutro Abs: 6.1 10*3/uL (ref 1.7–7.7)
Neutrophils Relative %: 64 %
Platelets: 349 10*3/uL (ref 150–400)
RBC: 5.07 MIL/uL (ref 3.87–5.11)
RDW: 22 % — ABNORMAL HIGH (ref 11.5–15.5)
WBC: 9.5 10*3/uL (ref 4.0–10.5)
nRBC: 0 % (ref 0.0–0.2)

## 2020-12-05 LAB — LIPASE, BLOOD: Lipase: 29 U/L (ref 11–51)

## 2020-12-05 LAB — RESP PANEL BY RT-PCR (FLU A&B, COVID) ARPGX2
Influenza A by PCR: NEGATIVE
Influenza B by PCR: NEGATIVE
SARS Coronavirus 2 by RT PCR: NEGATIVE

## 2020-12-05 LAB — COMPREHENSIVE METABOLIC PANEL
ALT: 19 U/L (ref 0–44)
AST: 22 U/L (ref 15–41)
Albumin: 4 g/dL (ref 3.5–5.0)
Alkaline Phosphatase: 33 U/L — ABNORMAL LOW (ref 38–126)
Anion gap: 8 (ref 5–15)
BUN: 8 mg/dL (ref 6–20)
CO2: 23 mmol/L (ref 22–32)
Calcium: 9.3 mg/dL (ref 8.9–10.3)
Chloride: 107 mmol/L (ref 98–111)
Creatinine, Ser: 0.7 mg/dL (ref 0.44–1.00)
GFR, Estimated: >60 mL/min (ref >60)
Glucose, Bld: 87 mg/dL (ref 70–99)
Potassium: 3.6 mmol/L (ref 3.5–5.1)
Sodium: 138 mmol/L (ref 135–145)
Total Bilirubin: 0.2 mg/dL — ABNORMAL LOW (ref 0.3–1.2)
Total Protein: 7 g/dL (ref 6.5–8.1)

## 2020-12-05 LAB — I-STAT BETA HCG BLOOD, ED (MC, WL, AP ONLY): I-stat hCG, quantitative: <5 m[IU]/mL (ref <5)

## 2020-12-05 MED ORDER — ACETAMINOPHEN 500 MG PO TABS
1000.0000 mg | ORAL_TABLET | Freq: Once | ORAL | Status: AC
  Administered 2020-12-05: 1000 mg via ORAL
  Filled 2020-12-05: qty 2

## 2020-12-05 MED ORDER — ONDANSETRON 4 MG PO TBDP
4.0000 mg | ORAL_TABLET | Freq: Once | ORAL | Status: AC
  Administered 2020-12-05: 4 mg via ORAL
  Filled 2020-12-05: qty 1

## 2020-12-05 NOTE — ED Provider Notes (Signed)
[facility] DEPT Provider Note   CSN: [id number] Arrival date & time: 12/04/20  2354     History Chief Complaint  Patient presents with  . Leg Cramping    Melinda Bridges is a 49 y.o. female presenting for evaluation of pain behind her knees.  Patient states she is having leg cramps, mostly behind both knees.  Pain is worse when she straightens out her legs.  No numbness or tingling.  No fall, trauma, or injury.  She states that the past several days, she has not been feeling well.  She has intermittent nausea and vomiting.  She has not been able to eat very much, mostly due to anxiety.  She is mostly on medication for depression, but is unable to afford it.  She does not have a primary care doctor.  She states she had vaginal bleeding 3 times last month, is not having any currently.  She is not on any hormones or birth control.  She denies fevers, chills, chest pain, shortness of breath, abdominal pain, urinary symptoms, abnormal bowel movements.  Patient states she is in a very stressful environment, is having a lot of family drama.  She does feel safe at home.  She denies SI, HI, or AVH    HPI     Past Medical History:  Diagnosis Date  . Anxiety    little unsettled, anxiety  . Depression    pk now    Patient Active Problem List   Diagnosis Date Noted  . SAB (spontaneous abortion) 04/30/2016  . Major depressive disorder, recurrent episode, moderate ([place]) 06/08/2013  . Mood disorder ([place]) 06/04/2013    Past Surgical History:  Procedure Laterality Date  . NO PAST SURGERIES       OB History    Gravida  7   Para  5   Term  5   Preterm      AB  1   Living  5     SAB  1   IAB      Ectopic      Multiple      Live Births  5           Family History  Problem Relation Age of Onset  . Diabetes Mother   . Hypertension Maternal Grandmother   . Diabetes Maternal Grandfather   . Hypertension Maternal Grandfather      Social History   Tobacco Use  . Smoking status: Former Smoker    Types: Cigarettes  . Smokeless tobacco: Never Used  . Tobacco comment: quit Jan 2019  Substance Use Topics  . Alcohol use: Yes    Alcohol/week: 0.0 standard drinks  . Drug use: Yes    Types: Marijuana    Home Medications Prior to Admission medications   Medication Sig Start Date End Date Taking? Authorizing Provider  benzonatate (TESSALON) 100 MG capsule Take 2 capsules (200 mg total) by mouth 2 (two) times daily as needed for cough. 01/01/17   Nona Dell, PA-C  HYDROcodone-acetaminophen (NORCO/VICODIN) 5-325 MG tablet Take 1 tablet by mouth every 6 (six) hours as needed for moderate pain. 06/17/18   Rasch, Anderson Malta I, NP  ibuprofen (ADVIL,MOTRIN) 800 MG tablet Take 1 tablet (800 mg total) by mouth 3 (three) times daily. 06/17/18   Rasch, Anderson Malta I, NP  misoprostol (CYTOTEC) 200 MCG tablet Take 3 tablets (600 mcg total) by mouth once for 1 dose. 06/17/18 06/17/18  Rasch, Anderson Malta I, NP  ondansetron (ZOFRAN ODT) 4 MG  disintegrating tablet Take 1 tablet (4 mg total) by mouth every 8 (eight) hours as needed for nausea or vomiting. 01/01/17   Nona Dell, PA-C  oxymetazoline (AFRIN NASAL SPRAY) 0.05 % nasal spray Place 1 spray into both nostrils 2 (two) times daily. 01/01/17   Nona Dell, PA-C  Prenatal Vit-Fe Phos-FA-Omega (VITAFOL GUMMIES) 3.33-0.333-34.8 MG CHEW Chew 3 tablets by mouth daily. 03/31/16   Morene Crocker, CNM    Allergies    Patient has no known allergies.  Review of Systems   Review of Systems  Gastrointestinal: Positive for nausea and vomiting.  Musculoskeletal: Positive for myalgias.  Psychiatric/Behavioral: The patient is nervous/anxious.   All other systems reviewed and are negative.   Physical Exam Updated Vital Signs BP 116/67 (BP Location: Right Arm)   Pulse 61   Temp 98.8 F (37.1 C) (Oral)   Resp 16   Ht 5\' 2"  (1.575 m)   Wt 53.5 kg   LMP  10/18/2020   SpO2 100%   BMI 21.58 kg/m   Physical Exam Vitals and nursing note reviewed.  Constitutional:      General: She is not in acute distress.    Appearance: She is well-developed and well-nourished.     Comments: Appears nontoxic  HENT:     Head: Normocephalic and atraumatic.  Eyes:     Extraocular Movements: EOM normal.     Conjunctiva/sclera: Conjunctivae normal.     Pupils: Pupils are equal, round, and reactive to light.  Cardiovascular:     Rate and Rhythm: Normal rate and regular rhythm.     Pulses: Normal pulses and intact distal pulses.  Pulmonary:     Effort: Pulmonary effort is normal. No respiratory distress.     Breath sounds: Normal breath sounds. No wheezing.     Comments: Clear lung sounds Abdominal:     General: There is no distension.     Palpations: Abdomen is soft. There is no mass.     Tenderness: There is no abdominal tenderness. There is no rebound.  Musculoskeletal:        General: Normal range of motion.     Cervical back: Normal range of motion and neck supple.     Comments: Diffuse ttp of bilateral lower legs. Pain with active and passive rom of the legs.   Skin:    General: Skin is warm and dry.     Capillary Refill: Capillary refill takes less than 2 seconds.  Neurological:     Mental Status: She is alert and oriented to person, place, and time.  Psychiatric:        Mood and Affect: Mood and affect normal.     ED Results / Procedures / Treatments   Labs (all labs ordered are listed, but only abnormal results are displayed) Labs Reviewed  CBC WITH DIFFERENTIAL/PLATELET - Abnormal; Notable for the following components:      Result Value   Hemoglobin 8.8 (*)    HCT 30.7 (*)    MCV 60.6 (*)    MCH 17.4 (*)    MCHC 28.7 (*)    RDW 22.0 (*)    All other components within normal limits  COMPREHENSIVE METABOLIC PANEL - Abnormal; Notable for the following components:   Alkaline Phosphatase 33 (*)    Total Bilirubin 0.2 (*)    All  other components within normal limits  URINALYSIS, ROUTINE W REFLEX MICROSCOPIC - Abnormal; Notable for the following components:   APPearance HAZY (*)  All other components within normal limits  RESP PANEL BY RT-PCR (FLU A&B, COVID) ARPGX2  LIPASE, BLOOD  I-STAT BETA HCG BLOOD, ED ([facility], [facility], [facility] ONLY)    EKG None  Radiology No results found.  Procedures Procedures (including critical care time)  Medications Ordered in ED Medications  acetaminophen (TYLENOL) tablet 1,000 mg (1,000 mg Oral Given 12/05/20 0240)  ondansetron (ZOFRAN-ODT) disintegrating tablet 4 mg (4 mg Oral Given 12/05/20 0240)    ED Course  I have reviewed the triage vital signs and the nursing notes.  Pertinent labs & imaging results that were available during my care of the patient were reviewed by me and considered in my medical decision making (see chart for details).    MDM Rules/Calculators/A&P                          Patient presenting for evaluation of leg pain and nausea and vomiting.  On exam, she appears nontoxic.  She does appear anxious.  She has diffuse tenderness palpation of the lower legs.  In the setting of vomiting and leg pain, will check labs to ensure no electrolyte abnormality.  Consider anemia.  Consider viral illness including Covid as cause for myalgia.  Will check urine to ensure no infection.  Labs interpreted by me, overall reassuring.  Patient is anemic, but this is at her baseline per chart review.  No new electrolyte abnormality.  Covid negative.  UA without infection.  On reassessment after Tylenol and Zofran, patient reports no improvement of symptoms.  She is tolerating p.o. without difficulty.  She has ambulated without difficulty.  I discussed continued symptomatic treatment and follow-up with PCP.  Resources given for [employer] and wellness.  At this time, patient appears safe for discharge.  Return precautions given.  Patient states she understands and agrees to  plan.  Final Clinical Impression(s) / ED Diagnoses Final diagnoses:  Myalgia    Rx / DC Orders ED Discharge Orders    None       Franchot Heidelberg, PA-C 12/05/20 0548    Molpus, Jenny Reichmann, MD 12/05/20 [phone]

## 2020-12-05 NOTE — Discharge Instructions (Addendum)
Try to eat regular meals and stay well hydrated as best as possible.  Take tylenol as needed for pain. Follow-up with the clinic listed below.  They may be to help with financial assistance for medication. Return to the emergency room with any new, worsening, or concerning symptoms.

## 2020-12-05 NOTE — ED Notes (Signed)
Pt able to ambulate in hall with minor gait disturbance. Reports feeling much better than at arrival.

## 2020-12-05 NOTE — ED Triage Notes (Signed)
Arrived by PTAR from home. Patient called with c/o of leg cramps. PTAR reports patient was hyperventilating with respirations of 30. Patient calmed en rout to this facility; respirations 18. Patient reports she cannot afford her psych medications

## 2020-12-05 NOTE — ED Notes (Signed)
Pt able to tolerate food and drink

## 2020-12-05 NOTE — ED Notes (Signed)
Discharge instructions discussed with patient. Verbalized understanding. Departs ED at this time in stable condition to wait in lobby for ride.

## 2023-07-30 ENCOUNTER — Other Ambulatory Visit: Payer: Self-pay

## 2023-07-30 ENCOUNTER — Encounter (HOSPITAL_COMMUNITY): Payer: Self-pay

## 2023-07-30 ENCOUNTER — Emergency Department (HOSPITAL_COMMUNITY)
Admission: EM | Admit: 2023-07-30 | Discharge: 2023-07-30 | Disposition: A | Discharge status: Home / Self Care | Payer: Medicaid Other | Attending: Emergency Medicine | Admitting: Emergency Medicine

## 2023-07-30 DIAGNOSIS — U071 COVID-19: Secondary | ICD-10-CM | POA: Insufficient documentation

## 2023-07-30 DIAGNOSIS — M791 Myalgia, unspecified site: Secondary | ICD-10-CM | POA: Diagnosis present

## 2023-07-30 LAB — RESP PANEL BY RT-PCR (RSV, FLU A&B, COVID)  RVPGX2
Influenza A by PCR: NEGATIVE
Influenza B by PCR: NEGATIVE
Resp Syncytial Virus by PCR: NEGATIVE
SARS Coronavirus 2 by RT PCR: POSITIVE — AB

## 2023-07-30 MED ORDER — ACETAMINOPHEN 325 MG PO TABS
650.0000 mg | ORAL_TABLET | Freq: Four times a day (QID) | ORAL | Status: DC | PRN
  Administered 2023-07-30: 650 mg via ORAL
  Filled 2023-07-30: qty 2

## 2023-07-30 MED ORDER — ONDANSETRON 4 MG PO TBDP
4.0000 mg | ORAL_TABLET | Freq: Once | ORAL | Status: AC
  Administered 2023-07-30: 4 mg via ORAL
  Filled 2023-07-30: qty 1

## 2023-07-30 NOTE — ED Triage Notes (Signed)
Pt presents to ED from home C/O cough, generalized body aches, chills since yesterday. Denies taking anything PTA.

## 2023-07-30 NOTE — ED Provider Notes (Signed)
[facility] EMERGENCY DEPARTMENT AT [facility] Provider Note   CSN: [id number] Arrival date & time: 07/30/23  7829     History  Chief Complaint  Patient presents with   Generalized Body Aches    Melinda Bridges is a 52 y.o. female.  Pt complains of bodyaches and cough.  Pt reports she began coughing on Friday.  Pt works in a daycare.    The history is provided by the patient. No language interpreter was used.       Home Medications Prior to Admission medications   Medication Sig Start Date End Date Taking? Authorizing Provider  benzonatate (TESSALON) 100 MG capsule Take 2 capsules (200 mg total) by mouth 2 (two) times daily as needed for cough. 01/01/17   Barrett Henle, PA-C  HYDROcodone-acetaminophen (NORCO/VICODIN) 5-325 MG tablet Take 1 tablet by mouth every 6 (six) hours as needed for moderate pain. 06/17/18   Rasch, Victorino Dike I, NP  ibuprofen (ADVIL,MOTRIN) 800 MG tablet Take 1 tablet (800 mg total) by mouth 3 (three) times daily. 06/17/18   Rasch, Victorino Dike I, NP  misoprostol (CYTOTEC) 200 MCG tablet Take 3 tablets (600 mcg total) by mouth once for 1 dose. 06/17/18 06/17/18  Rasch, Victorino Dike I, NP  ondansetron (ZOFRAN ODT) 4 MG disintegrating tablet Take 1 tablet (4 mg total) by mouth every 8 (eight) hours as needed for nausea or vomiting. 01/01/17   Barrett Henle, PA-C  oxymetazoline (AFRIN NASAL SPRAY) 0.05 % nasal spray Place 1 spray into both nostrils 2 (two) times daily. 01/01/17   Barrett Henle, PA-C  Prenatal Vit-Fe Phos-FA-Omega (VITAFOL GUMMIES) 3.33-0.333-34.8 MG CHEW Chew 3 tablets by mouth daily. 03/31/16   Roe Coombs, CNM      Allergies    Patient has no known allergies.    Review of Systems   Review of Systems  Constitutional:  Positive for fever.  Respiratory:  Positive for cough.   All other systems reviewed and are negative.   Physical Exam Updated Vital Signs BP 117/81 (BP Location: Left Arm)   Pulse 72    Temp 98.4 F (36.9 C) (Oral)   Resp 16   Ht 5\' 2"  (1.575 m)   Wt 53.1 kg   SpO2 99%   BMI 21.40 kg/m  Physical Exam Vitals and nursing note reviewed.  Constitutional:      Appearance: She is well-developed.  HENT:     Head: Normocephalic.     Mouth/Throat:     Mouth: Mucous membranes are moist.  Cardiovascular:     Rate and Rhythm: Normal rate.  Pulmonary:     Effort: Pulmonary effort is normal.  Abdominal:     General: Abdomen is flat. There is no distension.  Musculoskeletal:        General: Normal range of motion.     Cervical back: Normal range of motion.  Skin:    General: Skin is warm.  Neurological:     General: No focal deficit present.     Mental Status: She is alert and oriented to person, place, and time.  Psychiatric:        Mood and Affect: Mood normal.     ED Results / Procedures / Treatments   Labs (all labs ordered are listed, but only abnormal results are displayed) Labs Reviewed  RESP PANEL BY RT-PCR (RSV, FLU A&B, COVID)  RVPGX2 - Abnormal; Notable for the following components:      Result Value   SARS Coronavirus 2 by RT  PCR POSITIVE (*)    All other components within normal limits    EKG None  Radiology No results found.  Procedures Procedures    Medications Ordered in ED Medications  acetaminophen (TYLENOL) tablet 650 mg (has no administration in time range)  ondansetron (ZOFRAN-ODT) disintegrating tablet 4 mg (has no administration in time range)    ED Course/ Medical Decision Making/ A&P                                 Medical Decision Making Pt complains of cough and congestion   Amount and/or Complexity of Data Reviewed Independent Historian: parent Labs: ordered. Decision-making details documented in ED Course.    Details: Labs ordered reviewed and interpreted   Risk OTC drugs.           Final Clinical Impression(s) / ED Diagnoses Final diagnoses:  COVID    Rx / DC Orders ED Discharge Orders      None      An After Visit Summary was printed and given to the patient.    Elson Areas, [place] 07/30/23 1191    Jacalyn Lefevre, MD 07/30/23 1208

## 2023-07-30 NOTE — Discharge Instructions (Signed)
Tylenol every 4 hours
